# Patient Record
Sex: Female | Born: 1965 | Race: White | Hispanic: No | Marital: Single | Smoking: Current every day smoker
Health system: Southern US, Community
[De-identification: ages and names within clinical notes are randomized; demographics above are authoritative.]

## PROBLEM LIST (undated history)

## (undated) DIAGNOSIS — J111 Influenza due to unidentified influenza virus with other respiratory manifestations: Secondary | ICD-10-CM

## (undated) DIAGNOSIS — J45909 Unspecified asthma, uncomplicated: Secondary | ICD-10-CM

## (undated) DIAGNOSIS — R069 Unspecified abnormalities of breathing: Secondary | ICD-10-CM

## (undated) DIAGNOSIS — M1990 Unspecified osteoarthritis, unspecified site: Secondary | ICD-10-CM

## (undated) HISTORY — PX: CHOLECYSTECTOMY: SHX55

## (undated) HISTORY — PX: TUBAL LIGATION: SHX77

## (undated) HISTORY — DX: Unspecified osteoarthritis, unspecified site: M19.90

## (undated) HISTORY — DX: Influenza due to unidentified influenza virus with other respiratory manifestations: J11.1

## (undated) HISTORY — DX: Unspecified abnormalities of breathing: R06.9

## (undated) HISTORY — DX: Unspecified asthma, uncomplicated: J45.909

---

## 2005-01-27 ENCOUNTER — Emergency Department: Payer: Self-pay | Admitting: Emergency Medicine

## 2005-12-05 ENCOUNTER — Ambulatory Visit: Payer: Self-pay | Admitting: Internal Medicine

## 2006-08-10 ENCOUNTER — Ambulatory Visit: Payer: Self-pay | Admitting: Family Medicine

## 2006-08-13 ENCOUNTER — Ambulatory Visit: Payer: Self-pay | Admitting: Family Medicine

## 2006-10-21 ENCOUNTER — Ambulatory Visit: Payer: Self-pay | Admitting: Gastroenterology

## 2007-03-01 ENCOUNTER — Emergency Department: Payer: Self-pay | Admitting: Emergency Medicine

## 2007-03-10 ENCOUNTER — Ambulatory Visit: Payer: Self-pay | Admitting: Gastroenterology

## 2007-11-14 ENCOUNTER — Emergency Department: Payer: Self-pay | Admitting: Emergency Medicine

## 2008-08-14 ENCOUNTER — Emergency Department: Payer: Self-pay | Admitting: Emergency Medicine

## 2008-10-04 ENCOUNTER — Emergency Department: Payer: Self-pay | Admitting: Emergency Medicine

## 2013-05-11 ENCOUNTER — Ambulatory Visit: Payer: Self-pay | Admitting: Obstetrics and Gynecology

## 2013-05-11 LAB — BASIC METABOLIC PANEL
Anion Gap: 5 — ABNORMAL LOW (ref 7–16)
BUN: 9 mg/dL (ref 7–18)
Chloride: 102 mmol/L (ref 98–107)
Creatinine: 0.62 mg/dL (ref 0.60–1.30)
EGFR (African American): >60
EGFR (Non-African Amer.): >60
Glucose: 95 mg/dL (ref 65–99)
Potassium: 3.2 mmol/L — ABNORMAL LOW (ref 3.5–5.1)

## 2013-05-11 LAB — CBC
HCT: 43.7 % (ref 35.0–47.0)
HGB: 15.4 g/dL (ref 12.0–16.0)
MCHC: 35.3 g/dL (ref 32.0–36.0)
MCV: 92 fL (ref 80–100)
Platelet: 236 10*3/uL (ref 150–440)

## 2013-05-16 ENCOUNTER — Ambulatory Visit: Payer: Self-pay | Admitting: Obstetrics and Gynecology

## 2013-05-16 LAB — POTASSIUM: Potassium: 3.5 mmol/L (ref 3.5–5.1)

## 2013-05-17 LAB — CBC WITH DIFFERENTIAL/PLATELET
Basophil %: 0.4 %
Eosinophil #: 0.3 10*3/uL (ref 0.0–0.7)
Eosinophil %: 2.2 %
HCT: 35.7 % (ref 35.0–47.0)
HGB: 12.4 g/dL (ref 12.0–16.0)
Lymphocyte #: 3.9 10*3/uL — ABNORMAL HIGH (ref 1.0–3.6)
MCH: 32.3 pg (ref 26.0–34.0)
MCHC: 34.7 g/dL (ref 32.0–36.0)
MCV: 93 fL (ref 80–100)
Monocyte #: 0.9 x10 3/mm (ref 0.2–0.9)
Monocyte %: 7.4 %
Neutrophil %: 58.2 %
Platelet: 192 10*3/uL (ref 150–440)
RBC: 3.84 10*6/uL (ref 3.80–5.20)
RDW: 12.2 % (ref 11.5–14.5)

## 2014-07-17 ENCOUNTER — Ambulatory Visit: Payer: Self-pay | Admitting: Pain Medicine

## 2014-08-11 ENCOUNTER — Ambulatory Visit: Payer: Self-pay | Admitting: Pain Medicine

## 2014-08-11 LAB — BASIC METABOLIC PANEL
ANION GAP: 7 (ref 7–16)
BUN: 14 mg/dL (ref 7–18)
CREATININE: 0.78 mg/dL (ref 0.60–1.30)
Calcium, Total: 8.2 mg/dL — ABNORMAL LOW (ref 8.5–10.1)
Chloride: 103 mmol/L (ref 98–107)
Co2: 28 mmol/L (ref 21–32)
GLUCOSE: 97 mg/dL (ref 65–99)
OSMOLALITY: 276 (ref 275–301)
Potassium: 3.1 mmol/L — ABNORMAL LOW (ref 3.5–5.1)
Sodium: 138 mmol/L (ref 136–145)

## 2014-08-11 LAB — HEPATIC FUNCTION PANEL A (ARMC)
ALT: 23 U/L
Albumin: 3.6 g/dL (ref 3.4–5.0)
Alkaline Phosphatase: 80 U/L
Bilirubin, Direct: <0.1 mg/dL (ref 0.0–0.2)
Bilirubin,Total: 0.5 mg/dL (ref 0.2–1.0)
SGOT(AST): 20 U/L (ref 15–37)
Total Protein: 7.1 g/dL (ref 6.4–8.2)

## 2014-08-11 LAB — MAGNESIUM: MAGNESIUM: 1.6 mg/dL — AB

## 2014-08-11 LAB — SEDIMENTATION RATE: Erythrocyte Sed Rate: 11 mm/hr (ref 0–20)

## 2014-09-15 ENCOUNTER — Ambulatory Visit: Payer: Self-pay | Admitting: Pain Medicine

## 2015-01-19 NOTE — Op Note (Signed)
PATIENT NAME:  Vicki Fox, Vicki Fox MR#:  [id number] DATE OF BIRTH:  [birth year]  DATE OF PROCEDURE:  05/16/2013  PREOPERATIVE DIAGNOSIS: Chronic pelvic pain.   POSTOPERATIVE DIAGNOSES: 1.  Chronic pelvic pain.  2.  Dense pelvic adhesive disease.   OPERATIVE PROCEDURE: Laparoscopy with adhesiolysis.   SURGEON: Alanda Slim. Cresencia Asmus, MD  FIRST ASSISTANT: Herbert Moors  ANESTHESIA: General endotracheal.   INDICATIONS: The patient is a 49 year old single white female para 2-0-0-2 status post BTL for contraception with history of chronic lower abdominal pain who presents for surgical evaluation. Preoperative endometrial biopsy demonstrated chronic endometritis. Treatment with antibiotics did not alter her pelvic pain symptoms. The patient is now here to assess for etiology to pain including ruling out of endometriosis.   FINDINGS AT SURGERY: Revealed extensive abdominal wall, small bowel adhesive disease, primarily in the midline. There also were bilateral adnexal adhesions as well as lower uterine segment, anterior abdominal wall adhesions. Band of adhesions between the right abdominal sidewall and bowel were lysed. Adhesions overlying the bladder flap were also lysed. Findings intraoperatively included a uterus with a posterior fundal fibroid. The adhesions were noted in the bladder flap region. The right and left ovaries appeared normal, although the left ovary did demonstrate a simple cyst. Fallopian tubes were hyperemic and had the fallopian tubes nearby. Cul-de-sac did not reveal any obvious stigmata of endometriosis. Upper abdomen had extensive adhesions of the bowel to the midline. This area of involvement included the umbilicus where the primary trocar was placed. There was no evidence of bowel or vascular injury at the time of entry. However, the patient will be observed overnight to be sure there are no bowel issues.   DESCRIPTION OF PROCEDURE: The patient was brought to the operating room  where she was placed in the supine position. General endotracheal anesthesia was induced without difficulty. She was placed in the dorsal lithotomy position using the bumblebee stirrups. A ChloraPrep and Betadine abdominal, perineal and intravaginal prep and drape was performed in the standard fashion. A red Robinson catheter was used to drain 50 mL of urine from the bladder. A Hulka tenaculum was placed on the cervix to facilitate uterine manipulation. A subumbilical vertical incision just slightly to the right of midline was placed, and the 5 mm Optiview laparoscopic trocar was placed under direct visualization without evidence of bowel or vascular injury. The peritoneum was entered. CO2 pneumoperitoneum was created. A second port was placed in the left paramedian region, in the suprapubic region, under direct visualization. Further inspection demonstrated the above-noted findings, which were photo documented. Adhesions along the right abdominal wall were taken down using the Harmonic scalpel. Adhesions involving the bladder flap were likewise taken down with the Harmonic scalpel. The extensive adhesions between the loops of bowel and the anterior abdominal wall were not touched. Close observation of the initial entry port was made without obvious evidence of injury to bowel. However, because of the close proximity of bowel to the entry location we will observe the patient overnight. There was minimal bleeding encountered. Following completion of complete inspection of the pelvis, the procedure was terminated with all instrumentation being removed from the abdomen. The pneumoperitoneum was released. The incisions were closed with 4-0 Vicryl simple interrupted sutures and Dermabond was placed over top. The patient was then awakened, extubated and taken to the recovery room in satisfactory condition. Estimated was minimal. There were no complications none. All instruments, needle and sponge counts were verified as  correct.   ADDENDUM NOTE: Should this  patient require any future surgery, in the form of hysterectomy for her pelvic pain, a bowel prep and laparotomy probably in the midline will be needed with care to avoid bowel injury. I recommend a co-operation with a general should hysterectomy be needed.  ____________________________ Alanda Slim Catherine Cubero, MD mad:sb D: 05/16/2013 16:26:13 ET T: 05/17/2013 07:21:40 ET JOB#: [id number]  cc: Hassell Done A. Gerilyn Stargell, MD, <Dictator> Alanda Slim Raydell Maners MD ELECTRONICALLY SIGNED 05/27/2013 10:34

## 2015-04-12 ENCOUNTER — Ambulatory Visit: Payer: Self-pay | Admitting: Family Medicine

## 2015-04-19 ENCOUNTER — Encounter: Payer: Self-pay | Admitting: Family Medicine

## 2015-04-19 ENCOUNTER — Ambulatory Visit (INDEPENDENT_AMBULATORY_CARE_PROVIDER_SITE_OTHER): Payer: BLUE CROSS/BLUE SHIELD | Admitting: Family Medicine

## 2015-04-19 VITALS — BP 110/70 | HR 76 | Ht 58.5 in | Wt 138.8 lb

## 2015-04-19 DIAGNOSIS — G8929 Other chronic pain: Secondary | ICD-10-CM | POA: Diagnosis not present

## 2015-04-19 DIAGNOSIS — I10 Essential (primary) hypertension: Secondary | ICD-10-CM | POA: Insufficient documentation

## 2015-04-19 DIAGNOSIS — Z833 Family history of diabetes mellitus: Secondary | ICD-10-CM | POA: Insufficient documentation

## 2015-04-19 DIAGNOSIS — E559 Vitamin D deficiency, unspecified: Secondary | ICD-10-CM

## 2015-04-19 DIAGNOSIS — E663 Overweight: Secondary | ICD-10-CM | POA: Diagnosis not present

## 2015-04-19 DIAGNOSIS — K5900 Constipation, unspecified: Secondary | ICD-10-CM | POA: Diagnosis not present

## 2015-04-19 DIAGNOSIS — E039 Hypothyroidism, unspecified: Secondary | ICD-10-CM

## 2015-04-19 DIAGNOSIS — R109 Unspecified abdominal pain: Secondary | ICD-10-CM

## 2015-04-19 DIAGNOSIS — M797 Fibromyalgia: Secondary | ICD-10-CM

## 2015-04-19 MED ORDER — AMITRIPTYLINE HCL 25 MG PO TABS: 25.0000 mg | ORAL_TABLET | Freq: Every day | ORAL | Status: DC

## 2015-04-19 NOTE — Progress Notes (Signed)
Date:  04/19/2015   Name:  Vicki Fox   DOB:  1966-07-04   MRN:  [id number]  PCP:  Adline Potter, MD    Chief Complaint: No chief complaint on file.   History of Present Illness:  This is a 49 y.o. female with MMP as listed.Seeing pain med for chronic abdominal pain rx'ed with Percocet recently increased from bid to tid. C/o hand/hip/back pain, thinks has fibromyalgia, not sleeping well. C/o constipation, taking stool softeners, has tried mother's Miralax which seems to help. Thinks Synthroid dose too low, wants checked. Also thinks HCTZ dose low, takes daily but still gets occ pedal edema (none today), potassium and magnesium levels low in November.  Review of Systems:  Review of Systems  Constitutional: Positive for fatigue. Negative for fever.  HENT: Negative for ear pain and sore throat.   Eyes: Negative for pain.  Respiratory: Negative for shortness of breath.   Cardiovascular: Positive for leg swelling. Negative for chest pain.  Gastrointestinal: Positive for abdominal pain and constipation. Negative for vomiting and diarrhea.  Endocrine: Negative for polyuria.  Genitourinary: Negative for difficulty urinating.  Musculoskeletal: Positive for back pain and neck pain. Negative for gait problem.  Skin: Negative for rash.  Neurological: Negative for tremors, seizures and syncope.  Hematological: Negative for adenopathy.  Psychiatric/Behavioral: Negative for confusion.    Patient Active Problem List   Diagnosis Date Noted  . Fibromyalgia 04/19/2015  . Essential hypertension 04/19/2015  . Thyroid activity decreased 04/19/2015  . Vitamin D deficiency 04/19/2015  . Chronic abdominal pain 04/19/2015  . CN (constipation) 04/19/2015  . Overweight (BMI 25.0-29.9) 04/19/2015  . FH: diabetes mellitus 04/19/2015    Prior to Admission medications   Medication Sig Start Date End Date Taking? Authorizing Provider  hydrochlorothiazide (HYDRODIURIL) 25 MG tablet Take 1 tablet by  mouth daily at 2 PM daily at 2 PM. 04/01/15  Yes Historical Provider, MD  levothyroxine (SYNTHROID, LEVOTHROID) 200 MCG tablet Take 1 tablet by mouth daily. 04/01/15  Yes Historical Provider, MD  oxyCODONE-acetaminophen (PERCOCET) 10-325 MG per tablet Take 1 tablet by mouth every 8 (eight) hours. 04/10/15  Yes Historical Provider, MD  amitriptyline (ELAVIL) 25 MG tablet Take 1 tablet (25 mg total) by mouth at bedtime. 04/19/15   Adline Potter, MD    No Known Allergies  Past Surgical History  Procedure Laterality Date  . Cesarean section    . Cholecystectomy    . Tubal ligation      History  Substance Use Topics  . Smoking status: Current Every Day Smoker -- 1.00 packs/day for 30 years    Types: Cigarettes  . Smokeless tobacco: Not on file  . Alcohol Use: 0.0 oz/week    0 Standard drinks or equivalent per week    Family History  Problem Relation Age of Onset  . Diabetes Mother   . Diabetes Father   . Heart disease Father     Medication list has been reviewed and updated.  Physical Examination: BP 110/70 mmHg  Pulse 76  Ht 4' 10.5" (1.486 m)  Wt 138 lb 12.8 oz (62.959 kg)  BMI 28.51 kg/m2  Physical Exam  Constitutional: She is oriented to person, place, and time. She appears well-developed and well-nourished.  HENT:  Head: Normocephalic and atraumatic.  Eyes: Conjunctivae and EOM are normal. Pupils are equal, round, and reactive to light.  Neck: Neck supple. No thyromegaly present.  Cardiovascular: Normal rate, regular rhythm and normal heart sounds.   Pulmonary/Chest: Effort normal and breath  sounds normal.  Abdominal: Soft. She exhibits no distension and no mass. There is no tenderness.  Musculoskeletal: She exhibits no edema.  Lymphadenopathy:    She has no cervical adenopathy.  Neurological: She is alert and oriented to person, place, and time. Coordination normal.  Skin: No rash noted.  Psychiatric: She has a normal mood and affect. Her behavior is normal.     Assessment and Plan:  1. Fibromyalgia Begin Elavil 25 mg qhs  2. Essential hypertension Well controlled, cont HCTZ (needs refill if blood work ok) - Comprehensive Metabolic Panel (CMET) - CBC - Lipid Profile  3. Hypothyroidism, unspecified hypothyroidism type Needs refill if blood work ok - TSH  4. Vitamin D deficiency Continue supplementation - Vitamin D (25 hydroxy)  5. Chronic abdominal pain Cont Percocet per pain med  6. Constipation, unspecified constipation type ? Due to Percocet, ok to add Miralax  7. Overweight (BMI 25.0-29.9) Discussed weight Fox, exercise  8. FH: diabetes mellitus - HgB A1c  Return in about 4 weeks (around 05/17/2015).  Satira Anis. [facility]  04/19/2015

## 2015-04-30 NOTE — Progress Notes (Signed)
Please contact to see why blood work not done.

## 2015-05-02 ENCOUNTER — Telehealth: Payer: Self-pay

## 2015-05-02 NOTE — Telephone Encounter (Signed)
-----   Message from Adline Potter, MD sent at 04/30/2015 11:34 AM EDT ----- Please contact to see why blood work not done.

## 2015-05-21 ENCOUNTER — Other Ambulatory Visit: Payer: Self-pay

## 2015-05-21 ENCOUNTER — Ambulatory Visit: Payer: BLUE CROSS/BLUE SHIELD | Admitting: Family Medicine

## 2015-06-25 ENCOUNTER — Ambulatory Visit: Payer: Self-pay | Admitting: Family Medicine

## 2015-09-05 ENCOUNTER — Encounter: Payer: Self-pay | Admitting: Pain Medicine

## 2015-09-05 ENCOUNTER — Ambulatory Visit: Payer: BLUE CROSS/BLUE SHIELD | Attending: Pain Medicine | Admitting: Pain Medicine

## 2015-09-05 ENCOUNTER — Other Ambulatory Visit: Payer: Self-pay | Admitting: Pain Medicine

## 2015-09-05 VITALS — BP 142/68 | HR 71 | Temp 98.8°F | Resp 18 | Ht <= 58 in | Wt 136.0 lb

## 2015-09-05 DIAGNOSIS — M79606 Pain in leg, unspecified: Secondary | ICD-10-CM

## 2015-09-05 DIAGNOSIS — Z79891 Long term (current) use of opiate analgesic: Secondary | ICD-10-CM | POA: Insufficient documentation

## 2015-09-05 DIAGNOSIS — M160 Bilateral primary osteoarthritis of hip: Secondary | ICD-10-CM | POA: Diagnosis not present

## 2015-09-05 DIAGNOSIS — K660 Peritoneal adhesions (postprocedural) (postinfection): Secondary | ICD-10-CM | POA: Insufficient documentation

## 2015-09-05 DIAGNOSIS — Z5181 Encounter for therapeutic drug level monitoring: Secondary | ICD-10-CM

## 2015-09-05 DIAGNOSIS — F1190 Opioid use, unspecified, uncomplicated: Secondary | ICD-10-CM

## 2015-09-05 DIAGNOSIS — N949 Unspecified condition associated with female genital organs and menstrual cycle: Secondary | ICD-10-CM

## 2015-09-05 DIAGNOSIS — M797 Fibromyalgia: Secondary | ICD-10-CM

## 2015-09-05 DIAGNOSIS — I10 Essential (primary) hypertension: Secondary | ICD-10-CM | POA: Insufficient documentation

## 2015-09-05 DIAGNOSIS — M5416 Radiculopathy, lumbar region: Secondary | ICD-10-CM

## 2015-09-05 DIAGNOSIS — M47816 Spondylosis without myelopathy or radiculopathy, lumbar region: Secondary | ICD-10-CM | POA: Insufficient documentation

## 2015-09-05 DIAGNOSIS — J439 Emphysema, unspecified: Secondary | ICD-10-CM | POA: Diagnosis not present

## 2015-09-05 DIAGNOSIS — F17200 Nicotine dependence, unspecified, uncomplicated: Secondary | ICD-10-CM

## 2015-09-05 DIAGNOSIS — M545 Low back pain: Secondary | ICD-10-CM

## 2015-09-05 DIAGNOSIS — F17290 Nicotine dependence, other tobacco product, uncomplicated: Secondary | ICD-10-CM

## 2015-09-05 DIAGNOSIS — Z720 Tobacco use: Secondary | ICD-10-CM

## 2015-09-05 DIAGNOSIS — R7982 Elevated C-reactive protein (CRP): Secondary | ICD-10-CM

## 2015-09-05 DIAGNOSIS — E041 Nontoxic single thyroid nodule: Secondary | ICD-10-CM

## 2015-09-05 DIAGNOSIS — F329 Major depressive disorder, single episode, unspecified: Secondary | ICD-10-CM | POA: Diagnosis not present

## 2015-09-05 DIAGNOSIS — R109 Unspecified abdominal pain: Secondary | ICD-10-CM

## 2015-09-05 DIAGNOSIS — R102 Pelvic and perineal pain: Secondary | ICD-10-CM | POA: Diagnosis not present

## 2015-09-05 DIAGNOSIS — J449 Chronic obstructive pulmonary disease, unspecified: Secondary | ICD-10-CM | POA: Insufficient documentation

## 2015-09-05 DIAGNOSIS — G8929 Other chronic pain: Secondary | ICD-10-CM | POA: Insufficient documentation

## 2015-09-05 DIAGNOSIS — M5410 Radiculopathy, site unspecified: Secondary | ICD-10-CM

## 2015-09-05 DIAGNOSIS — E559 Vitamin D deficiency, unspecified: Secondary | ICD-10-CM

## 2015-09-05 DIAGNOSIS — M25559 Pain in unspecified hip: Secondary | ICD-10-CM

## 2015-09-05 DIAGNOSIS — G5601 Carpal tunnel syndrome, right upper limb: Secondary | ICD-10-CM

## 2015-09-05 DIAGNOSIS — Z79899 Other long term (current) drug therapy: Secondary | ICD-10-CM

## 2015-09-05 DIAGNOSIS — M4726 Other spondylosis with radiculopathy, lumbar region: Secondary | ICD-10-CM

## 2015-09-05 DIAGNOSIS — K829 Disease of gallbladder, unspecified: Secondary | ICD-10-CM | POA: Insufficient documentation

## 2015-09-05 DIAGNOSIS — G629 Polyneuropathy, unspecified: Secondary | ICD-10-CM

## 2015-09-05 DIAGNOSIS — F1120 Opioid dependence, uncomplicated: Secondary | ICD-10-CM

## 2015-09-05 DIAGNOSIS — F32A Depression, unspecified: Secondary | ICD-10-CM | POA: Insufficient documentation

## 2015-09-05 DIAGNOSIS — F17210 Nicotine dependence, cigarettes, uncomplicated: Secondary | ICD-10-CM | POA: Insufficient documentation

## 2015-09-05 DIAGNOSIS — M549 Dorsalgia, unspecified: Secondary | ICD-10-CM | POA: Diagnosis present

## 2015-09-05 DIAGNOSIS — N736 Female pelvic peritoneal adhesions (postinfective): Secondary | ICD-10-CM | POA: Insufficient documentation

## 2015-09-05 DIAGNOSIS — R1084 Generalized abdominal pain: Secondary | ICD-10-CM | POA: Insufficient documentation

## 2015-09-05 DIAGNOSIS — Z7189 Other specified counseling: Secondary | ICD-10-CM

## 2015-09-05 DIAGNOSIS — M792 Neuralgia and neuritis, unspecified: Secondary | ICD-10-CM | POA: Insufficient documentation

## 2015-09-05 MED ORDER — OXYCODONE-ACETAMINOPHEN 10-325 MG PO TABS: 1.0000 | ORAL_TABLET | Freq: Three times a day (TID) | ORAL | Status: DC | PRN

## 2015-09-05 NOTE — Progress Notes (Signed)
Safety precautions to be maintained throughout the outpatient stay will include: orient to surroundings, keep bed in low position, maintain call bell within reach at all times, provide assistance with transfer out of bed and ambulation. Pill count oxycodone #2 

## 2015-09-05 NOTE — Progress Notes (Signed)
Patient's Name: Vicki Fox MRN: [id number] DOB: [birth year] DOS: 09/05/2015  Primary Reason(s) for Visit: Encounter for Medication Management CC: Back Pain   HPI:   Ms. Gunn is a 49 y.o. year old, female patient, who returns today as an established patient. She has Fibromyalgia; Essential hypertension; Hypothyroidism; Vitamin D deficiency; Chronic abdominal pain; CN (constipation); Overweight (BMI 25.0-29.9); FH: diabetes mellitus; Chronic pain; Long term current use of opiate analgesic; Long term prescription opiate use; Opiate use; Opioid dependence, daily use ([place]); Encounter for therapeutic drug level monitoring; Encounter for chronic pain management; Depression; Gallbladder disease; Adhesions Pelvic; Generalized abdominal pain (Location of Primary Pain); Adhesions Abdominal; Chronic pelvic pain in female; Chronic low back pain (Right); Lumbar spondylosis; Chronic lower extremity pain; Chronic lumbar radicular pain; Osteoarthritis, hip, bilateral; Chronic hip pain (Bilateral) (R>L); CRP elevated; Thyroid nodule; COPD with emphysema ([place]); Nicotine dependence; Hypomagnesemia; Tobacco abuse; Neuropathy ([place]); Neurogenic pain; Neuropathic pain; and Right carpal tunnel syndrome on her problem list.. Her primarily concern today is the Back Pain     The patient returns to the clinic today for pharmacological management of her chronic pain. The patient is pending to be evaluated by her primary care physician tomorrow since apparently they found a thyroid nodule. She will will be worked up for this. With regards to her chronic problems, they seem to be stable at this point with the current therapy.  Today's Pain Score: 7 , clinically she looks like a 1-2/10. Reported level of pain is incompatible with clinical obrservations. This may be secondary to a possible lack of understanding on how the pain scale works. Pain Type: Chronic pain Pain Location: Back (abdomen) Pain Orientation: Lower Pain  Descriptors / Indicators: Sharp Pain Frequency: Constant  Date of Last Visit: 05/16/15 Service Provided on Last Visit: Med Refill  Pharmacotherapy Review:   Side-effects or Adverse reactions: None reported. Effectiveness: Described as relatively effective, allowing for increase in activities of daily living (ADL). Onset of action: Within expected pharmacological parameters. Duration of action: Within normal limits for medication. Peak effect: Timing and results are as within normal expected parameters. [place] PMP: Compliant with practice rules and regulations. UDS Results:  No UDS available, at this time. UDS Interpretation: No UDS available, at this time Medication Assessment Form: Reviewed. Patient indicates being compliant with therapy Treatment compliance: Compliant Substance Use Disorder (SUD) Risk Level: Low Pharmacologic Plan: Continue therapy as is  Last Available Lab Work: No visits with results within 3 Month(s) from this visit. Latest known visit with results is:  [facility] Conversion on 08/11/2014  Component Date Value Ref Range Status  . Magnesium 08/11/2014 1.6*  Final  . Glucose 08/11/2014 97  65-99 mg/dL Final  . BUN 08/11/2014 14  7-18 mg/dL Final  . Creatinine 08/11/2014 0.78  0.60-1.30 mg/dL Final  . Sodium 08/11/2014 138  136-145 mmol/L Final  . Potassium 08/11/2014 3.1* 3.5-5.1 mmol/L Final  . Chloride 08/11/2014 103  98-107 mmol/L Final  . Co2 08/11/2014 28  21-32 mmol/L Final  . Calcium, Total 08/11/2014 8.2* 8.5-10.1 mg/dL Final  . Osmolality 08/11/2014 276  275-301 Final  . Anion Gap 08/11/2014 7  7-16 Final  . EGFR (African American) 08/11/2014 >60  >60m/min Final  . EGFR (Non-African Amer.) 08/11/2014 >60  >634mmin Final  . SGOT(AST) 08/11/2014 20  15-37 Unit/L Final  . SGPT (ALT) 08/11/2014 23   Final  . Alkaline Phosphatase 08/11/2014 80   Final  . Albumin 08/11/2014 3.6  3.4-5.0 g/dL Final  . Total Protein 08/11/2014  7.1  6.4-8.2 g/dL Final  .  Bilirubin,Total 08/11/2014 0.5  0.2-1.0 mg/dL Final  . Bilirubin, Direct 08/11/2014 < 0.1  0.0-0.2 mg/dL Final  . Erythrocyte Sed Rate 08/11/2014 11  0-20 mm/hr Final    Allergies: Ms. Vankirk has No Known Allergies.  Meds: The patient has a current medication list which includes the following prescription(s): amitriptyline, vitamin d3, hydrochlorothiazide, ibuprofen, levothyroxine, oxycodone-acetaminophen, polyethylene glycol powder, oxycodone-acetaminophen, and oxycodone-acetaminophen. Requested Prescriptions   Signed Prescriptions Disp Refills  . oxyCODONE-acetaminophen (PERCOCET) 10-325 MG tablet 90 tablet 0    Sig: Take 1 tablet by mouth every 8 (eight) hours as needed for pain.  Marland Kitchen oxyCODONE-acetaminophen (PERCOCET) 10-325 MG tablet 90 tablet 0    Sig: Take 1 tablet by mouth every 8 (eight) hours as needed for pain.  Marland Kitchen oxyCODONE-acetaminophen (PERCOCET) 10-325 MG tablet 90 tablet 0    Sig: Take 1 tablet by mouth every 8 (eight) hours as needed for pain.    ROS: Constitutional: Afebrile, no chills, well hydrated and well nourished Gastrointestinal: negative Musculoskeletal:negative Neurological: negative Behavioral/Psych: negative  PFSH: Medical:  Ms. Creely  has a past medical history of Arthritis and Asthma. Family: family history includes Diabetes in her father and mother; Heart disease in her father. Surgical:  has past surgical history that includes Cesarean section; Cholecystectomy; and Tubal ligation. Tobacco:  reports that she has been smoking Cigarettes.  She has a 30 pack-year smoking history. She does not have any smokeless tobacco history on file. Alcohol:  reports that she drinks alcohol. Drug:  reports that she does not use illicit drugs.  Physical Exam: Vitals:  Today's Vitals   09/05/15 0801 09/05/15 0803  BP: 142/68   Pulse: 71   Temp: 98.8 F (37.1 C)   Resp: 18   Height: 4' 10"  (1.473 m)   Weight: 136 lb (61.689 kg)   SpO2: 99%   PainSc: 7  7    PainLoc: Back   Calculated BMI: Body mass index is 28.43 kg/(m^2). General appearance: alert, cooperative, appears stated age, no distress and mildly obese Eyes: PERLA Respiratory: No evidence respiratory distress, no audible rales or ronchi and no use of accessory muscles of respiration Neck: no adenopathy, no carotid bruit, no JVD, supple, symmetrical, trachea midline and thyroid not enlarged, symmetric, no tenderness/mass/nodules  Cervical Spine ROM: Adequate for flexion, extension, rotation, and lateral bending Palpation: No palpable trigger points  Upper Extremities ROM: Adequate bilaterally Strength: 5/5 for all flexors and extensors of the upper extremity, bilaterally Pulses: Palpable bilaterally Neurologic: No allodynia, No hyperesthesia, No hyperpathia and No sensory abnormalities  Lumbar Spine ROM: Adequate for flexion, extension, rotation, and lateral bending Palpation: No palpable trigger points Lumbar Hyperextension and rotation: Non-contributory Patrick's Maneuver: Non-contributory  Lower Extremities ROM: Adequate bilaterally Strength: 5/5 for all flexors and extensors of the lower extremity, bilaterally Pulses: Palpable bilaterally Neurologic: No allodynia, No hyperesthesia, No hyperpathia, No sensory abnormalities and No antalgic gait or posture  Assessment: Encounter Diagnosis:  Primary Diagnosis: Osteoarthritis of spine with radiculopathy, lumbar region [M47.26]  Plan: Interventional: None will be scheduled at this point saying she is stable.  Johnika was seen today for back pain.  Diagnoses and all orders for this visit:  Osteoarthritis of spine with radiculopathy, lumbar region  Generalized abdominal pain  Fibromyalgia  Chronic pelvic pain in female  Chronic pain -     oxyCODONE-acetaminophen (PERCOCET) 10-325 MG tablet; Take 1 tablet by mouth every 8 (eight) hours as needed for pain. -  oxyCODONE-acetaminophen (PERCOCET) 10-325 MG tablet; Take  1 tablet by mouth every 8 (eight) hours as needed for pain. -     oxyCODONE-acetaminophen (PERCOCET) 10-325 MG tablet; Take 1 tablet by mouth every 8 (eight) hours as needed for pain. -     COMPLETE METABOLIC PANEL WITH GFR; Future -     C-reactive protein; Future -     Magnesium; Future -     Sedimentation rate; Future -     Vitamin D pnl(25-hydrxy+1,25-dihy)-bld; Future -     B12 and Folate Panel; Future  Chronic lumbar radicular pain  Chronic low back pain  Chronic abdominal pain  Adhesions Pelvic  Adhesions Abdominal  Opioid dependence, daily use ([facility])  Opiate use  Long term prescription opiate use  Long term current use of opiate analgesic -     Drugs of abuse screen w/o alc, rtn urine-sln  Encounter for therapeutic drug level monitoring  Encounter for chronic pain management  Vitamin D deficiency -     Vitamin D pnl(25-hydrxy+1,25-dihy)-bld; Future  Primary osteoarthritis of both hips  Chronic hip pain, unspecified laterality  CRP elevated  Thyroid nodule  Pulmonary emphysema, unspecified emphysema type (H[facility])  Dependence on nicotine from other tobacco product  Hypomagnesemia  Tobacco abuse  Neuropathy (H[facility])  Neurogenic pain  Neuropathic pain  Right carpal tunnel syndrome     There are no Patient Instructions on file for this visit. Medications discontinued today:  Medications Discontinued During This Encounter  Medication Reason  . levothyroxine (SYNTHROID, LEVOTHROID) 200 MCG tablet Error  . oxyCODONE-acetaminophen (PERCOCET) 10-325 MG per tablet Reorder  . ondansetron (ZOFRAN) 4 MG tablet Error   Medications administered today:  Ms. Birkhead had no medications administered during this visit.  Primary Care Physician: Christie Nottingham., PA Location: [facility] Note by: Kathlen Brunswick. Dossie Arbour, M.D, DABA, DABAPM, DABPM, DABIPP, FIPP

## 2015-09-11 ENCOUNTER — Other Ambulatory Visit: Payer: Self-pay

## 2015-09-11 LAB — TOXASSURE SELECT 13 (MW), URINE: PDF: 0

## 2015-12-03 ENCOUNTER — Encounter: Payer: BLUE CROSS/BLUE SHIELD | Admitting: Pain Medicine

## 2015-12-06 ENCOUNTER — Ambulatory Visit (HOSPITAL_BASED_OUTPATIENT_CLINIC_OR_DEPARTMENT_OTHER): Payer: BLUE CROSS/BLUE SHIELD | Admitting: Pain Medicine

## 2015-12-06 ENCOUNTER — Encounter: Payer: Self-pay | Admitting: Pain Medicine

## 2015-12-06 ENCOUNTER — Other Ambulatory Visit
Admission: RE | Admit: 2015-12-06 | Discharge: 2015-12-06 | Disposition: A | Discharge status: Home / Self Care | Payer: BLUE CROSS/BLUE SHIELD | Source: Ambulatory Visit | Attending: Pain Medicine | Admitting: Pain Medicine

## 2015-12-06 VITALS — BP 153/96 | HR 74 | Temp 98.3°F | Resp 16 | Ht 58.5 in | Wt 140.0 lb

## 2015-12-06 DIAGNOSIS — G8929 Other chronic pain: Secondary | ICD-10-CM | POA: Insufficient documentation

## 2015-12-06 DIAGNOSIS — M545 Low back pain: Secondary | ICD-10-CM | POA: Diagnosis not present

## 2015-12-06 DIAGNOSIS — Z79891 Long term (current) use of opiate analgesic: Secondary | ICD-10-CM | POA: Insufficient documentation

## 2015-12-06 DIAGNOSIS — Z5181 Encounter for therapeutic drug level monitoring: Secondary | ICD-10-CM

## 2015-12-06 DIAGNOSIS — F1190 Opioid use, unspecified, uncomplicated: Secondary | ICD-10-CM

## 2015-12-06 DIAGNOSIS — M47816 Spondylosis without myelopathy or radiculopathy, lumbar region: Secondary | ICD-10-CM | POA: Insufficient documentation

## 2015-12-06 LAB — COMPREHENSIVE METABOLIC PANEL
ALBUMIN: 4.6 g/dL (ref 3.5–5.0)
ALT: 17 U/L (ref 14–54)
ANION GAP: 10 (ref 5–15)
AST: 19 U/L (ref 15–41)
Alkaline Phosphatase: 74 U/L (ref 38–126)
BILIRUBIN TOTAL: 0.5 mg/dL (ref 0.3–1.2)
BUN: 13 mg/dL (ref 6–20)
CO2: 27 mmol/L (ref 22–32)
Calcium: 9.6 mg/dL (ref 8.9–10.3)
Chloride: 101 mmol/L (ref 101–111)
Creatinine, Ser: 0.75 mg/dL (ref 0.44–1.00)
GFR calc non Af Amer: >60 mL/min (ref >60)
GLUCOSE: 91 mg/dL (ref 65–99)
POTASSIUM: 3.7 mmol/L (ref 3.5–5.1)
SODIUM: 138 mmol/L (ref 135–145)
TOTAL PROTEIN: 7.9 g/dL (ref 6.5–8.1)

## 2015-12-06 LAB — SEDIMENTATION RATE: Sed Rate: 3 mm/hr (ref 0–20)

## 2015-12-06 LAB — MAGNESIUM: Magnesium: 1.6 mg/dL — ABNORMAL LOW (ref 1.7–2.4)

## 2015-12-06 MED ORDER — OXYCODONE-ACETAMINOPHEN 10-325 MG PO TABS: 1.0000 | ORAL_TABLET | Freq: Three times a day (TID) | ORAL | Status: DC | PRN

## 2015-12-06 NOTE — Patient Instructions (Addendum)
Smoking Cessation, Tips for Success If you are ready to quit smoking, congratulations! You have chosen to help yourself be healthier. Cigarettes bring nicotine, tar, carbon monoxide, and other irritants into your body. Your lungs, heart, and blood vessels will be able to work better without these poisons. There are many different ways to quit smoking. Nicotine gum, nicotine patches, a nicotine inhaler, or nicotine nasal spray can help with physical craving. Hypnosis, support groups, and medicines help break the habit of smoking. WHAT THINGS CAN I DO TO MAKE QUITTING EASIER?  Here are some tips to help you quit for good: 1. Pick a date when you will quit smoking completely. Tell all of your friends and family about your plan to quit on that date. 2. Do not try to slowly cut down on the number of cigarettes you are smoking. Pick a quit date and quit smoking completely starting on that day. 3. Throw away all cigarettes.  4. Clean and remove all ashtrays from your home, work, and car. 5. On a card, write down your reasons for quitting. Carry the card with you and read it when you get the urge to smoke. 6. Cleanse your body of nicotine. Drink enough water and fluids to keep your urine clear or pale yellow. Do this after quitting to flush the nicotine from your body. 7. Learn to predict your moods. Do not let a bad situation be your excuse to have a cigarette. Some situations in your life might tempt you into wanting a cigarette. 8. Never have "just one" cigarette. It leads to wanting another and another. Remind yourself of your decision to quit. 9. Change habits associated with smoking. If you smoked while driving or when feeling stressed, try other activities to replace smoking. Stand up when drinking your coffee. Brush your teeth after eating. Sit in a different chair when you read the paper. Avoid alcohol while trying to quit, and try to drink fewer caffeinated beverages. Alcohol and caffeine may urge you  to smoke. 10. Avoid foods and drinks that can trigger a desire to smoke, such as sugary or spicy foods and alcohol. 11. Ask people who smoke not to smoke around you. 12. Have something planned to do right after eating or having a cup of coffee. For example, plan to take a walk or exercise. 13. Try a relaxation exercise to calm you down and decrease your stress. Remember, you may be tense and nervous for the first 2 weeks after you quit, but this will pass. 14. Find new activities to keep your hands busy. Play with a pen, coin, or rubber band. Doodle or draw things on paper. 15. Brush your teeth right after eating. This will help cut down on the craving for the taste of tobacco after meals. You can also try mouthwash.  16. Use oral substitutes in place of cigarettes. Try using lemon drops, carrots, cinnamon sticks, or chewing gum. Keep them handy so they are available when you have the urge to smoke. 17. When you have the urge to smoke, try deep breathing. 18. Designate your home as a nonsmoking area. 19. If you are a heavy smoker, ask your health care provider about a prescription for nicotine chewing gum. It can ease your withdrawal from nicotine. 20. Reward yourself. Set aside the cigarette money you save and buy yourself something nice. 21. Look for support from others. Join a support group or smoking cessation program. Ask someone at home or at work to help you with your plan   to quit smoking. 22. Always ask yourself, "Do I need this cigarette or is this just a reflex?" Tell yourself, "Today, I choose not to smoke," or "I do not want to smoke." You are reminding yourself of your decision to quit. 23. Do not replace cigarette smoking with electronic cigarettes (commonly called e-cigarettes). The safety of e-cigarettes is unknown, and some may contain harmful chemicals. 24. If you relapse, do not give up! Plan ahead and think about what you will do the next time you get the urge to smoke. HOW WILL  I FEEL WHEN I QUIT SMOKING? You may have symptoms of withdrawal because your body is used to nicotine (the addictive substance in cigarettes). You may crave cigarettes, be irritable, feel very hungry, cough often, get headaches, or have difficulty concentrating. The withdrawal symptoms are only temporary. They are strongest when you first quit but will go away within 10-14 days. When withdrawal symptoms occur, stay in control. Think about your reasons for quitting. Remind yourself that these are signs that your body is healing and getting used to being without cigarettes. Remember that withdrawal symptoms are easier to treat than the major diseases that smoking can cause.  Even after the withdrawal is over, expect periodic urges to smoke. However, these cravings are generally short lived and will go away whether you smoke or not. Do not smoke! WHAT RESOURCES ARE AVAILABLE TO HELP ME QUIT SMOKING? Your health care provider can direct you to community resources or hospitals for support, which may include:  Group support.  Education.  Hypnosis.  Therapy.   This information is not intended to replace advice given to you by your health care provider. Make sure you discuss any questions you have with your health care provider.   Document Released: 06/13/2004 Document Revised: 10/06/2014 Document Reviewed: 03/03/2013 Elsevier Interactive Patient Education 2016 [employer] GENERAL RISKS AND COMPLICATIONS  What are the risk, side effects and possible complications? Generally speaking, most procedures are safe.  However, with any procedure there are risks, side effects, and the possibility of complications.  The risks and complications are dependent upon the sites that are lesioned, or the type of nerve block to be performed.  The closer the procedure is to the spine, the more serious the risks are.  Great care is taken when placing the radio frequency needles, block needles or lesioning probes, but  sometimes complications can occur. 1. Infection: Any time there is an injection through the skin, there is a risk of infection.  This is why sterile conditions are used for these blocks.  There are four possible types of infection. 1. Localized skin infection. 2. Central Nervous System Infection-This can be in the form of Meningitis, which can be deadly. 3. Epidural Infections-This can be in the form of an epidural abscess, which can cause pressure inside of the spine, causing compression of the spinal cord with subsequent paralysis. This would require an emergency surgery to decompress, and there are no guarantees that the patient would recover from the paralysis. 4. Discitis-This is an infection of the intervertebral discs.  It occurs in about 1% of discography procedures.  It is difficult to treat and it may lead to surgery.        2. Pain: the needles have to go through skin and soft tissues, will cause soreness.       3. Damage to internal structures:  The nerves to be lesioned may be near blood vessels or    other nerves which can   be potentially damaged.       4. Bleeding: Bleeding is more common if the patient is taking blood thinners such as  aspirin, Coumadin, Ticiid, Plavix, etc., or if he/she have some genetic predisposition  such as hemophilia. Bleeding into the spinal canal can cause compression of the spinal  cord with subsequent paralysis.  This would require an emergency surgery to  decompress and there are no guarantees that the patient would recover from the  paralysis.       5. Pneumothorax:  Puncturing of a lung is a possibility, every time a needle is introduced in  the area of the chest or upper back.  Pneumothorax refers to free air around the  collapsed lung(s), inside of the thoracic cavity (chest cavity).  Another two possible  complications related to a similar event would include: Hemothorax and Chylothorax.   These are variations of the Pneumothorax, where instead of air  around the collapsed  lung(s), you may have blood or chyle, respectively.       6. Spinal headaches: They may occur with any procedures in the area of the spine.       7. Persistent CSF (Cerebro-Spinal Fluid) leakage: This is a rare problem, but may occur  with prolonged intrathecal or epidural catheters either due to the formation of a fistulous  track or a dural tear.       8. Nerve damage: By working so close to the spinal cord, there is always a possibility of  nerve damage, which could be as serious as a permanent spinal cord injury with  paralysis.       9. Death:  Although rare, severe deadly allergic reactions known as "Anaphylactic  reaction" can occur to any of the medications used.      10. Worsening of the symptoms:  We can always make thing worse.  What are the chances of something like this happening? Chances of any of this occuring are extremely low.  By statistics, you have more of a chance of getting killed in a motor vehicle accident: while driving to the hospital than any of the above occurring .  Nevertheless, you should be aware that they are possibilities.  In general, it is similar to taking a shower.  Everybody knows that you can slip, hit your head and get killed.  Does that mean that you should not shower again?  Nevertheless always keep in mind that statistics do not mean anything if you happen to be on the wrong side of them.  Even if a procedure has a 1 (one) in a 1,000,000 (million) chance of going wrong, it you happen to be that one..Also, keep in mind that by statistics, you have more of a chance of having something go wrong when taking medications.  Who should not have this procedure? If you are on a blood thinning medication (e.g. Coumadin, Plavix, see list of "Blood Thinners"), or if you have an active infection going on, you should not have the procedure.  If you are taking any blood thinners, please inform your physician.  How should I prepare for this  procedure?  Do not eat or drink anything at least six hours prior to the procedure.  Bring a driver with you .  It cannot be a taxi.  Come accompanied by an adult that can drive you back, and that is strong enough to help you if your legs get weak or numb from the local anesthetic.  Take all of your medicines   the morning of the procedure with just enough water to swallow them.  If you have diabetes, make sure that you are scheduled to have your procedure done first thing in the morning, whenever possible.  If you have diabetes, take only half of your insulin dose and notify our nurse that you have done so as soon as you arrive at the clinic.  If you are diabetic, but only take blood sugar pills (oral hypoglycemic), then do not take them on the morning of your procedure.  You may take them after you have had the procedure.  Do not take aspirin or any aspirin-containing medications, at least eleven (11) days prior to the procedure.  They may prolong bleeding.  Wear loose fitting clothing that may be easy to take off and that you would not mind if it got stained with Betadine or blood.  Do not wear any jewelry or perfume  Remove any nail coloring.  It will interfere with some of our monitoring equipment.  NOTE: Remember that this is not meant to be interpreted as a complete list of all possible complications.  Unforeseen problems may occur.  BLOOD THINNERS The following drugs contain aspirin or other products, which can cause increased bleeding during surgery and should not be taken for 2 weeks prior to and 1 week after surgery.  If you should need take something for relief of minor pain, you may take acetaminophen which is found in Tylenol,m Datril, Anacin-3 and Panadol. It is not blood thinner. The products listed below are.  Do not take any of the products listed below in addition to any listed on your instruction sheet.  A.P.C or A.P.C with Codeine Codeine Phosphate Capsules #3  Ibuprofen Ridaura  ABC compound Congesprin Imuran rimadil  Advil Cope Indocin Robaxisal  Alka-Seltzer Effervescent Pain Reliever and Antacid Coricidin or Coricidin-D  Indomethacin Rufen  Alka-Seltzer plus Cold Medicine Cosprin Ketoprofen S-A-C Tablets  Anacin Analgesic Tablets or Capsules Coumadin Korlgesic Salflex  Anacin Extra Strength Analgesic tablets or capsules CP-2 Tablets Lanoril Salicylate  Anaprox Cuprimine Capsules Levenox Salocol  Anexsia-D Dalteparin Magan Salsalate  Anodynos Darvon compound Magnesium Salicylate Sine-off  Ansaid Dasin Capsules Magsal Sodium Salicylate  Anturane Depen Capsules Marnal Soma  APF Arthritis pain formula Dewitt's Pills Measurin Stanback  Argesic Dia-Gesic Meclofenamic Sulfinpyrazone  Arthritis Bayer Timed Release Aspirin Diclofenac Meclomen Sulindac  Arthritis pain formula Anacin Dicumarol Medipren Supac  Analgesic (Safety coated) Arthralgen Diffunasal Mefanamic Suprofen  Arthritis Strength Bufferin Dihydrocodeine Mepro Compound Suprol  Arthropan liquid Dopirydamole Methcarbomol with Aspirin Synalgos  ASA tablets/Enseals Disalcid Micrainin Tagament  Ascriptin Doan's Midol Talwin  Ascriptin A/D Dolene Mobidin Tanderil  Ascriptin Extra Strength Dolobid Moblgesic Ticlid  Ascriptin with Codeine Doloprin or Doloprin with Codeine Momentum Tolectin  Asperbuf Duoprin Mono-gesic Trendar  Aspergum Duradyne Motrin or Motrin IB Triminicin  Aspirin plain, buffered or enteric coated Durasal Myochrisine Trigesic  Aspirin Suppositories Easprin Nalfon Trillsate  Aspirin with Codeine Ecotrin Regular or Extra Strength Naprosyn Uracel  Atromid-S Efficin Naproxen Ursinus  Auranofin Capsules Elmiron Neocylate Vanquish  Axotal Emagrin Norgesic Verin  Azathioprine Empirin or Empirin with Codeine Normiflo Vitamin E  Azolid Emprazil Nuprin Voltaren  Bayer Aspirin plain, buffered or children's or timed BC Tablets or powders Encaprin Orgaran Warfarin Sodium   Buff-a-Comp Enoxaparin Orudis Zorpin  Buff-a-Comp with Codeine Equegesic Os-Cal-Gesic   Buffaprin Excedrin plain, buffered or Extra Strength Oxalid   Bufferin Arthritis Strength Feldene Oxphenbutazone   Bufferin plain or Extra Strength Feldene Capsules Oxycodone with Aspirin     Bufferin with Codeine Fenoprofen Fenoprofen Pabalate or Pabalate-SF   Buffets II Flogesic Panagesic   Buffinol plain or Extra Strength Florinal or Florinal with Codeine Panwarfarin   Buf-Tabs Flurbiprofen Penicillamine   Butalbital Compound Four-way cold tablets Penicillin   Butazolidin Fragmin Pepto-Bismol   Carbenicillin Geminisyn Percodan   Carna Arthritis Reliever Geopen Persantine   Carprofen Gold's salt Persistin   Chloramphenicol Goody's Phenylbutazone   Chloromycetin Haltrain Piroxlcam   Clmetidine heparin Plaquenil   Cllnoril Hyco-pap Ponstel   Clofibrate Hydroxy chloroquine Propoxyphen         Before stopping any of these medications, be sure to consult the physician who ordered them.  Some, such as Coumadin (Warfarin) are ordered to prevent or treat serious conditions such as "deep thrombosis", "pumonary embolisms", and other heart problems.  The amount of time that you may need off of the medication may also vary with the medication and the reason for which you were taking it.  If you are taking any of these medications, please make sure you notify your pain physician before you undergo any procedures.         Facet Blocks Patient Information  Description: The facets are joints in the spine between the vertebrae.  Like any joints in the body, facets can become irritated and painful.  Arthritis can also effect the facets.  By injecting steroids and local anesthetic in and around these joints, we can temporarily block the nerve supply to them.  Steroids act directly on irritated nerves and tissues to reduce selling and inflammation which often leads to decreased pain.  Facet blocks may be done  anywhere along the spine from the neck to the low back depending upon the location of your pain.   After numbing the skin with local anesthetic (like Novocaine), a small needle is passed onto the facet joints under x-ray guidance.  You may experience a sensation of pressure while this is being done.  The entire block usually lasts about 15-25 minutes.   Conditions which may be treated by facet blocks:   Low back/buttock pain  Neck/shoulder pain  Certain types of headaches  Preparation for the injection:  1. Do not eat any solid food or dairy products within 8 hours of your appointment. 2. You may drink clear liquid up to 3 hours before appointment.  Clear liquids include water, black coffee, juice or soda.  No milk or cream please. 3. You may take your regular medication, including pain medications, with a sip of water before your appointment.  Diabetics should hold regular insulin (if taken separately) and take 1/2 normal NPH dose the morning of the procedure.  Carry some sugar containing items with you to your appointment. 4. A driver must accompany you and be prepared to drive you home after your procedure. 5. Bring all your current medications with you. 6. An IV may be inserted and sedation may be given at the discretion of the physician. 7. A blood pressure cuff, EKG and other monitors will often be applied during the procedure.  Some patients may need to have extra oxygen administered for a short period. 8. You will be asked to provide medical information, including your allergies and medications, prior to the procedure.  We must know immediately if you are taking blood thinners (like Coumadin/Warfarin) or if you are allergic to IV iodine contrast (dye).  We must know if you could possible be pregnant.  Possible side-effects:   Bleeding from needle site  Infection (rare, may require surgery)    Nerve injury (rare)  Numbness & tingling (temporary)  Difficulty urinating (rare,  temporary)  Spinal headache (a headache worse with upright posture)  Light-headedness (temporary)  Pain at injection site (serveral days)  Decreased blood pressure (rare, temporary)  Weakness in arm/leg (temporary)  Pressure sensation in back/neck (temporary)   Call if you experience:   Fever/chills associated with headache or increased back/neck pain  Headache worsened by an upright position  New onset, weakness or numbness of an extremity below the injection site  Hives or difficulty breathing (go to the emergency room)  Inflammation or drainage at the injection site(s)  Severe back/neck pain greater than usual  New symptoms which are concerning to you  Please note:  Although the local anesthetic injected can often make your back or neck feel good for several hours after the injection, the pain will likely return. It takes 3-7 days for steroids to work.  You may not notice any pain relief for at least one week.  If effective, we will often do a series of 2-3 injections spaced 3-6 weeks apart to maximally decrease your pain.  After the initial series, you may be a candidate for a more permanent nerve block of the facets.  If you have any questions, please call #[phone] [place] to get [facility]

## 2015-12-06 NOTE — Assessment & Plan Note (Signed)
The patient's low back pain appears to be of the facetal etiology. The patient has been offered a diagnostic bilateral lumbar facet block under fluoroscopic guidance and IV sedation to [place] if this is the source of her pain. If it is, she may be a good candidate for radiofrequency ablation.

## 2015-12-06 NOTE — Progress Notes (Signed)
Safety precautions to be maintained throughout the outpatient stay will include: orient to surroundings, keep bed in low position, maintain call bell within reach at all times, provide assistance with transfer out of bed and ambulation. Percocet pill coount # 0/90  Filled 11-04-15

## 2015-12-06 NOTE — Progress Notes (Signed)
Patient's Name: Vicki Fox MRN: [id number] DOB: 06-30-1966 DOS: 12/06/2015  Primary Reason(s) for Visit: Encounter for Medication Management CC: Back Pain and Abdominal Pain   HPI  [name] is a 50 y.o. year old, female patient, who returns today as an established patient. She has Fibromyalgia; Essential hypertension; Hypothyroidism; Vitamin D deficiency; Chronic abdominal pain (Location of Secondary source of pain) (Epigastric); CN (constipation); Overweight (BMI 25.0-29.9); FH: diabetes mellitus; Chronic pain; Long term current use of opiate analgesic; Long term prescription opiate use; Opiate use (45 MME/Day); Opioid dependence, daily use ([place]); Encounter for therapeutic drug level monitoring; Encounter for chronic pain management; Depression; Gallbladder disease; Adhesions Pelvic; Generalized abdominal pain (Location of Secondary source of pain) (Epigastric); Adhesions Abdominal; Chronic pelvic pain in female; Chronic low back pain (Location of Primary Source of Pain) (Bilateral) (L>R); Lumbar spondylosis; Chronic lower extremity pain (Bilateral) (L>R); Chronic lumbar radicular pain; Osteoarthritis of hip (Location of Tertiary source of pain) (Bilateral) (L>R); Chronic hip pain (Location of Tertiary source of pain) (Bilateral) (L>R); CRP elevated; Thyroid nodule; COPD with emphysema ([place]); Nicotine dependence; Hypomagnesemia; Tobacco abuse; Neuropathy ([place]); Neurogenic pain; Neuropathic pain; Right carpal tunnel syndrome; and Lumbar facet syndrome (Location of Primary Source of Pain) (Bilateral) (L>R) on her problem list.. Her primarily concern today is the Back Pain and Abdominal Pain   The patient comes into clinic today for pharmacological management of her chronic pain. According to the patient and the primary pain is located in the lower back with the left side being worse than the right. Her second area of pain is her abdomen with most of the pain in the epigastric region. She describes that  this pain is secondary to scar tissue from a cholecystectomy that she had in 2007 where they had a complication with a bowel perforation. Her third area of most pain is the hips with the left being worst on the right. The patient has pain on hyperextension and rotation compatible with bilateral lumbar facet syndrome.  Pain Assessment: Self-Reported Pain Score: 7 , clinically she looks like a 3/10. Reported level is compatible with observation Pain Type: Chronic pain Pain Location: Back (stomach) Pain Orientation: Lower, Mid Pain Descriptors / Indicators: Stabbing, Constant Pain Frequency: Constant  Date of Last Visit: 09/05/15 Service Provided on Last Visit: Med Refill  Controlled Substance Pharmacotherapy Assessment  Analgesic: Oxycodone/APAP 10/325 one tablet every 8 hours when necessary (30 mg/day) MME/day: 45 mg/day Pharmacokinetics: Onset of action (Liberation/Absorption): Within expected pharmacological parameters. (One hour) Time to Peak effect (Distribution): Timing and results are as within normal expected parameters. (2 hours) Duration of action (Metabolism/Excretion): Within normal limits for medication. (7-8 hours) Pharmacodynamics: Analgesic Effect: 100% Activity Facilitation: Medication(s) allow patient to sit, stand, walk, and do the basic ADLs Perceived Effectiveness: Described as relatively effective, allowing for increase in activities of daily living (ADL) Side-effects or Adverse reactions: None reported Monitoring: [place] PMP: Compliant with practice rules and regulations UDS Results/interpretation: The patient's last UDS was done on 09/05/2015 and it came back within normal limits with no unexpected results. Medication Assessment Form: Reviewed. Patient indicates being compliant with therapy Treatment compliance: Compliant Risk Assessment: Aberrant Behavior: None observed today Substance Use Disorder (SUD) Risk Level: Low Opioid Risk Tool (ORT) Score: Total Score:  0 Low Risk for SUD (Score <3) Depression Scale Score: PHQ-2: PHQ-2 Total Score: 0 No depression (0) PHQ-9: PHQ-9 Total Score: 0 No depression (0-4)  Pharmacologic Plan: No change in therapy, at this time   Laboratory Workup  Last ED  UDS: No results found for: THCU, COCAINSCRNUR, PCPSCRNUR, MDMA, AMPHETMU, METHADONE, ETOH  Inflammation Markers Lab Results  Component Value Date   ESRSEDRATE 3 12/06/2015    Renal Function Lab Results  Component Value Date   BUN 13 12/06/2015   CREATININE 0.75 12/06/2015   GFRAA >60 12/06/2015   GFRNONAA >60 12/06/2015    Hepatic Function Lab Results  Component Value Date   AST 19 12/06/2015   ALT 17 12/06/2015   ALBUMIN 4.6 12/06/2015    Electrolytes Lab Results  Component Value Date   NA 138 12/06/2015   K 3.7 12/06/2015   CL 101 12/06/2015   CALCIUM 9.6 12/06/2015   MG 1.6* 12/06/2015    Allergies  Ms. Tromp has No Known Allergies.  Meds  The patient has a current medication list which includes the following prescription(s): amitriptyline, fluticasone, hydrochlorothiazide, ibuprofen, levothyroxine, oxycodone-acetaminophen, oxycodone-acetaminophen, oxycodone-acetaminophen, and polyethylene glycol powder.  Current Outpatient Prescriptions on File Prior to Visit  Medication Sig  . amitriptyline (ELAVIL) 25 MG tablet Take 1 tablet (25 mg total) by mouth at bedtime.  . hydrochlorothiazide (HYDRODIURIL) 25 MG tablet Take 1 tablet by mouth daily at 2 PM daily at 2 PM.  . ibuprofen (ADVIL,MOTRIN) 200 MG tablet Take 800 mg by mouth every 8 (eight) hours as needed.  Marland Kitchen levothyroxine (SYNTHROID, LEVOTHROID) 100 MCG tablet Take 100 mcg by mouth daily before breakfast.  . polyethylene glycol powder (GLYCOLAX/MIRALAX) powder take 17GM (DISSOLVED IN WATER) by mouth once daily   No current facility-administered medications on file prior to visit.    ROS  Constitutional: Afebrile, no chills, well hydrated and well  nourished Gastrointestinal: negative Musculoskeletal:negative Neurological: negative Behavioral/Psych: negative  PFSH  Medical:  Ms. Loveall  has a past medical history of Arthritis and Asthma. Family: family history includes Diabetes in her father and mother; Heart disease in her father. Surgical:  has past surgical history that includes Cesarean section; Cholecystectomy; and Tubal ligation. Tobacco:  reports that she has been smoking Cigarettes.  She has a 30 pack-year smoking history. She does not have any smokeless tobacco history on file. Alcohol:  reports that she drinks alcohol. Drug:  reports that she does not use illicit drugs.  Physical Exam  Vitals:  Today's Vitals   12/06/15 1257 12/06/15 1259 12/06/15 1300  BP:  153/96   Pulse: 74    Temp: 98.3 F (36.8 C)    Resp: 16    Height: 4' 10.5" (1.486 m)    Weight: 140 lb (63.504 kg)    SpO2: 98%    PainSc: 7  7  7    PainLoc: Back      Calculated BMI: Body mass index is 28.76 kg/(m^2). Overweight (25-29.9 kg/m2) - 20% higher incidence of chronic pain  General appearance: alert, cooperative, appears stated age and no distress Eyes: PERLA Respiratory: No evidence respiratory distress, no audible rales or ronchi and no use of accessory muscles of respiration  Lumbar Spine Exam  Inspection: No gross anomalies detected Alignment: Symetrical Palpation: Tender ROM:  Flexion: Restricted ROM Extension: Restricted ROM Lateral Bending: Restricted ROM Rotation: Restricted ROM Provocative Tests:  Lumbar Hyperextension and rotation test:  Positive for facet pain bilaterally Patrick's Maneuver: deferred  Gait Evaluation  Gait: WNL  Lower Extremity Exam  Inspection: No gross anomalies detected ROM: Adequate Sensory:  Normal Motor: Unremarkable  Toe walk (S1): WNL  Heal walk (L5): WNL  Assessment & Plan  Primary Diagnosis & Pertinent Problem List: The primary encounter diagnosis was Chronic pain.  Diagnoses of  Encounter for therapeutic drug level monitoring, Long term current use of opiate analgesic, Chronic low back pain (Right), Lumbar spondylosis, unspecified spinal osteoarthritis, Lumbar facet syndrome (Location of Primary Source of Pain) (Bilateral) (L>R), and Opiate use (45 MME/Day) were also pertinent to this visit.  Visit Diagnosis: 1. Chronic pain   2. Encounter for therapeutic drug level monitoring   3. Long term current use of opiate analgesic   4. Chronic low back pain (Right)   5. Lumbar spondylosis, unspecified spinal osteoarthritis   6. Lumbar facet syndrome (Location of Primary Source of Pain) (Bilateral) (L>R)   7. Opiate use (45 MME/Day)     Problem-specific Plan(s): Chronic low back pain (Location of Primary Source of Pain) (Bilateral) (L>R) The patient's low back pain appears to be of the facetal etiology. The patient has been offered a diagnostic bilateral lumbar facet block under fluoroscopic guidance and IV sedation to [place] if this is the source of her pain. If it is, she may be a good candidate for radiofrequency ablation.    Plan of Care  Pharmacotherapy (Medications Ordered): Meds ordered this encounter  Medications  . oxyCODONE-acetaminophen (PERCOCET) 10-325 MG tablet    Sig: Take 1 tablet by mouth every 8 (eight) hours as needed for pain.    Dispense:  90 tablet    Refill:  0    Do not place this medication, or any other prescription from our practice, on "Automatic Refill". Patient may have prescription filled one day early if pharmacy is closed on scheduled refill date. Do not fill until: 12/06/15 To last until: 01/05/16  . oxyCODONE-acetaminophen (PERCOCET) 10-325 MG tablet    Sig: Take 1 tablet by mouth every 8 (eight) hours as needed for pain.    Dispense:  90 tablet    Refill:  0    Do not place this medication, or any other prescription from our practice, on "Automatic Refill". Patient may have prescription filled one day early if pharmacy is closed  on scheduled refill date. Do not fill until: 01/05/16 To last until: 02/04/16  . oxyCODONE-acetaminophen (PERCOCET) 10-325 MG tablet    Sig: Take 1 tablet by mouth every 8 (eight) hours as needed for pain.    Dispense:  90 tablet    Refill:  0    Do not place this medication, or any other prescription from our practice, on "Automatic Refill". Patient may have prescription filled one day early if pharmacy is closed on scheduled refill date. Do not fill until: 02/04/16 To last until: 03/05/16    [facility] & Procedure Ordered: Orders Placed This Encounter  Procedures  . LUMBAR FACET(MEDIAL BRANCH NERVE BLOCK) MBNB    Standing Status: Standing     Number of Occurrences: 1     Standing Expiration Date: 12/05/2016    Scheduling Instructions:     Side: Bilateral     Level: L2, L3, L4, L5, & S1 Medial Branch Nerve     Sedation: With Sedation.     Timeframe: PRN Procedure. Patient will call to schedule.    Order Specific Question:  Where will this procedure be performed?    Answer:  [facility]  . ToxASSURE Select 13 (MW), Urine    Volume: 30 ml(s). Minimum 3 ml of urine is needed. Document temperature of fresh sample. Indications: Long term (current) use of opiate analgesic (Z79.891)  . Comprehensive metabolic panel    Standing Status: Future     Number of Occurrences: 1  Standing Expiration Date: 01/05/2016    Order Specific Question:  Has the patient fasted?    Answer:  No  . C-reactive protein    Standing Status: Future     Number of Occurrences: 1     Standing Expiration Date: 01/05/2016  . Magnesium    Standing Status: Future     Number of Occurrences: 1     Standing Expiration Date: 01/05/2016  . Sedimentation rate    Standing Status: Future     Number of Occurrences: 1     Standing Expiration Date: 01/05/2016  . Vitamin B12    Indication: Bone Pain (M89.9)    Standing Status: Future     Number of Occurrences: 1     Standing Expiration Date: 01/05/2016  . Vitamin D  pnl(25-hydrxy+1,25-dihy)-bld    Standing Status: Future     Number of Occurrences: 1     Standing Expiration Date: 01/05/2016    Imaging Ordered: None  Interventional Therapies: Scheduled: None at this time. PRN Procedures: Diagnostic, bilateral, lumbar facet block under fluoroscopic guidance and IV sedation.    Referral(s) or Consult(s): None at this time.  Medications administered during this visit: Ms. Everist had no medications administered during this visit.  Future Appointments Date Time Provider [place]  03/05/2016 1:20 PM Milinda Pointer, MD [facility] None    Primary Care Physician: Christie Nottingham., PA Location: [facility] Facility Note by: Kathlen Brunswick Dossie Arbour, M.D, DABA, DABAPM, DABPM, DABIPP, FIPP  Pain Score Disclaimer: We use the NRS-11 scale. This is a self-reported, subjective measurement of pain severity with only modest accuracy. It is used primarily to identify changes within a particular patient. It must be understood that outpatient pain scales are significantly less accurate that those used for research, where they can be applied under ideal controlled circumstances with minimal exposure to variables. In reality, the score is likely to be a combination of pain intensity and pain affect, where pain affect describes the degree of emotional arousal or changes in action readiness caused by the sensory experience of pain. Factors such as social and work situation, setting, emotional state, anxiety levels, expectation, and prior pain experience may influence pain perception and show large inter-individual differences that may also be affected by time variables.

## 2015-12-07 LAB — VITAMIN D PNL(25-HYDRXY+1,25-DIHY)-BLD
Vit D, 1,25-Dihydroxy: 28.6 pg/mL (ref 19.9–79.3)
Vit D, 25-Hydroxy: 20.9 ng/mL — ABNORMAL LOW (ref 30.0–100.0)

## 2015-12-07 LAB — VITAMIN B12: VITAMIN B 12: 306 pg/mL (ref 180–914)

## 2015-12-07 LAB — C-REACTIVE PROTEIN

## 2015-12-13 ENCOUNTER — Other Ambulatory Visit: Payer: Self-pay | Admitting: Pain Medicine

## 2015-12-13 DIAGNOSIS — E559 Vitamin D deficiency, unspecified: Secondary | ICD-10-CM

## 2015-12-13 LAB — TOXASSURE SELECT 13 (MW), URINE: PDF: 0

## 2015-12-13 MED ORDER — VITAMIN D (ERGOCALCIFEROL) 1.25 MG (50000 UNIT) PO CAPS: ORAL_CAPSULE | ORAL | Status: DC

## 2015-12-13 MED ORDER — MAGNESIUM OXIDE -MG SUPPLEMENT 500 MG PO CAPS: 1.0000 | ORAL_CAPSULE | Freq: Every day | ORAL | Status: DC

## 2015-12-13 MED ORDER — VITAMIN D3 50 MCG (2000 UT) PO CAPS: ORAL_CAPSULE | ORAL | Status: AC

## 2015-12-13 NOTE — Progress Notes (Signed)

## 2015-12-13 NOTE — Progress Notes (Signed)
Quick Note:  Normal Magnesium levels are between 1.6 and 2.3 mEq/L. Low magnesium blood level can lead to low calcium and potassium levels. Low levels may indicate inadequate dietary consuming, poor absorbtion, or excessive excretion. Signs and symptoms may include: leg cramps, foot pain, muscle twitches, loss of appetite, nausea, vomiting, fatigue, weakness, numbness, tingling, seizures, personality changes, abnormal heart rhythms, and/or coronary artery spasms.  ______ 

## 2015-12-13 NOTE — Progress Notes (Signed)
Quick Note:  Lab results reviewed and found to be within normal limits. ______ 

## 2016-03-05 ENCOUNTER — Encounter: Payer: Self-pay | Admitting: Pain Medicine

## 2016-03-05 ENCOUNTER — Ambulatory Visit: Payer: BLUE CROSS/BLUE SHIELD | Attending: Pain Medicine | Admitting: Pain Medicine

## 2016-03-05 VITALS — BP 158/78 | HR 74 | Temp 98.2°F | Resp 16 | Ht <= 58 in | Wt 145.0 lb

## 2016-03-05 DIAGNOSIS — F17210 Nicotine dependence, cigarettes, uncomplicated: Secondary | ICD-10-CM | POA: Insufficient documentation

## 2016-03-05 DIAGNOSIS — M47816 Spondylosis without myelopathy or radiculopathy, lumbar region: Secondary | ICD-10-CM | POA: Insufficient documentation

## 2016-03-05 DIAGNOSIS — M5416 Radiculopathy, lumbar region: Secondary | ICD-10-CM

## 2016-03-05 DIAGNOSIS — K5900 Constipation, unspecified: Secondary | ICD-10-CM | POA: Insufficient documentation

## 2016-03-05 DIAGNOSIS — R1013 Epigastric pain: Secondary | ICD-10-CM | POA: Insufficient documentation

## 2016-03-05 DIAGNOSIS — G5601 Carpal tunnel syndrome, right upper limb: Secondary | ICD-10-CM | POA: Diagnosis not present

## 2016-03-05 DIAGNOSIS — F329 Major depressive disorder, single episode, unspecified: Secondary | ICD-10-CM | POA: Insufficient documentation

## 2016-03-05 DIAGNOSIS — I10 Essential (primary) hypertension: Secondary | ICD-10-CM | POA: Diagnosis not present

## 2016-03-05 DIAGNOSIS — R102 Pelvic and perineal pain: Secondary | ICD-10-CM | POA: Diagnosis not present

## 2016-03-05 DIAGNOSIS — E119 Type 2 diabetes mellitus without complications: Secondary | ICD-10-CM | POA: Insufficient documentation

## 2016-03-05 DIAGNOSIS — N949 Unspecified condition associated with female genital organs and menstrual cycle: Secondary | ICD-10-CM

## 2016-03-05 DIAGNOSIS — G8929 Other chronic pain: Secondary | ICD-10-CM | POA: Diagnosis not present

## 2016-03-05 DIAGNOSIS — M25559 Pain in unspecified hip: Secondary | ICD-10-CM

## 2016-03-05 DIAGNOSIS — M545 Low back pain: Secondary | ICD-10-CM | POA: Diagnosis not present

## 2016-03-05 DIAGNOSIS — E039 Hypothyroidism, unspecified: Secondary | ICD-10-CM | POA: Diagnosis not present

## 2016-03-05 DIAGNOSIS — E041 Nontoxic single thyroid nodule: Secondary | ICD-10-CM | POA: Diagnosis not present

## 2016-03-05 DIAGNOSIS — M1990 Unspecified osteoarthritis, unspecified site: Secondary | ICD-10-CM | POA: Diagnosis not present

## 2016-03-05 DIAGNOSIS — E559 Vitamin D deficiency, unspecified: Secondary | ICD-10-CM | POA: Insufficient documentation

## 2016-03-05 DIAGNOSIS — M797 Fibromyalgia: Secondary | ICD-10-CM | POA: Diagnosis not present

## 2016-03-05 DIAGNOSIS — K660 Peritoneal adhesions (postprocedural) (postinfection): Secondary | ICD-10-CM | POA: Diagnosis not present

## 2016-03-05 DIAGNOSIS — M79606 Pain in leg, unspecified: Secondary | ICD-10-CM | POA: Diagnosis not present

## 2016-03-05 DIAGNOSIS — Z79891 Long term (current) use of opiate analgesic: Secondary | ICD-10-CM | POA: Diagnosis not present

## 2016-03-05 DIAGNOSIS — Z5181 Encounter for therapeutic drug level monitoring: Secondary | ICD-10-CM | POA: Diagnosis not present

## 2016-03-05 DIAGNOSIS — Z6829 Body mass index (BMI) 29.0-29.9, adult: Secondary | ICD-10-CM | POA: Insufficient documentation

## 2016-03-05 DIAGNOSIS — E663 Overweight: Secondary | ICD-10-CM | POA: Insufficient documentation

## 2016-03-05 DIAGNOSIS — R109 Unspecified abdominal pain: Secondary | ICD-10-CM

## 2016-03-05 DIAGNOSIS — R1084 Generalized abdominal pain: Secondary | ICD-10-CM

## 2016-03-05 DIAGNOSIS — M549 Dorsalgia, unspecified: Secondary | ICD-10-CM | POA: Diagnosis present

## 2016-03-05 MED ORDER — OXYCODONE-ACETAMINOPHEN 10-325 MG PO TABS: 1.0000 | ORAL_TABLET | Freq: Three times a day (TID) | ORAL | Status: DC | PRN

## 2016-03-05 MED ORDER — OXYCODONE-ACETAMINOPHEN 10-325 MG PO TABS
1.0000 | ORAL_TABLET | Freq: Three times a day (TID) | ORAL | Status: DC | PRN
Start: 2016-03-05 — End: 2016-06-04

## 2016-03-05 NOTE — Patient Instructions (Addendum)
A prescription for OXYCODONE X 3 was given to you today.  Smoking Cessation, Tips for Success If you are ready to quit smoking, congratulations! You have chosen to help yourself be healthier. Cigarettes bring nicotine, tar, carbon monoxide, and other irritants into your body. Your lungs, heart, and blood vessels will be able to work better without these poisons. There are many different ways to quit smoking. Nicotine gum, nicotine patches, a nicotine inhaler, or nicotine nasal spray can help with physical craving. Hypnosis, support groups, and medicines help break the habit of smoking. WHAT THINGS CAN I DO TO MAKE QUITTING EASIER?  Here are some tips to help you quit for good:  Pick a date when you will quit smoking completely. Tell all of your friends and family about your plan to quit on that date.  Do not try to slowly cut down on the number of cigarettes you are smoking. Pick a quit date and quit smoking completely starting on that day.  Throw away all cigarettes.   Clean and remove all ashtrays from your home, work, and car.  On a card, write down your reasons for quitting. Carry the card with you and read it when you get the urge to smoke.  Cleanse your body of nicotine. Drink enough water and fluids to keep your urine clear or pale yellow. Do this after quitting to flush the nicotine from your body.  Learn to predict your moods. Do not let a bad situation be your excuse to have a cigarette. Some situations in your life might tempt you into wanting a cigarette.  Never have "just one" cigarette. It leads to wanting another and another. Remind yourself of your decision to quit.  Change habits associated with smoking. If you smoked while driving or when feeling stressed, try other activities to replace smoking. Stand up when drinking your coffee. Brush your teeth after eating. Sit in a different chair when you read the paper. Avoid alcohol while trying to quit, and try to drink fewer  caffeinated beverages. Alcohol and caffeine may urge you to smoke.  Avoid foods and drinks that can trigger a desire to smoke, such as sugary or spicy foods and alcohol.  Ask people who smoke not to smoke around you.  Have something planned to do right after eating or having a cup of coffee. For example, plan to take a walk or exercise.  Try a relaxation exercise to calm you down and decrease your stress. Remember, you may be tense and nervous for the first 2 weeks after you quit, but this will pass.  Find new activities to keep your hands busy. Play with a pen, coin, or rubber band. Doodle or draw things on paper.  Brush your teeth right after eating. This will help cut down on the craving for the taste of tobacco after meals. You can also try mouthwash.   Use oral substitutes in place of cigarettes. Try using lemon drops, carrots, cinnamon sticks, or chewing gum. Keep them handy so they are available when you have the urge to smoke.  When you have the urge to smoke, try deep breathing.  Designate your home as a nonsmoking area.  If you are a heavy smoker, ask your health care provider about a prescription for nicotine chewing gum. It can ease your withdrawal from nicotine.  Reward yourself. Set aside the cigarette money you save and buy yourself something nice.  Look for support from others. Join a support group or smoking cessation program. Ask  someone at home or at work to help you with your plan to quit smoking.  Always ask yourself, "Do I need this cigarette or is this just a reflex?" Tell yourself, "Today, I choose not to smoke," or "I do not want to smoke." You are reminding yourself of your decision to quit.  Do not replace cigarette smoking with electronic cigarettes (commonly called e-cigarettes). The safety of e-cigarettes is unknown, and some may contain harmful chemicals.  If you relapse, do not give up! Plan ahead and think about what you will do the next time you get  the urge to smoke. HOW WILL I FEEL WHEN I QUIT SMOKING? You may have symptoms of withdrawal because your body is used to nicotine (the addictive substance in cigarettes). You may crave cigarettes, be irritable, feel very hungry, cough often, get headaches, or have difficulty concentrating. The withdrawal symptoms are only temporary. They are strongest when you first quit but will go away within 10-14 days. When withdrawal symptoms occur, stay in control. Think about your reasons for quitting. Remind yourself that these are signs that your body is healing and getting used to being without cigarettes. Remember that withdrawal symptoms are easier to treat than the major diseases that smoking can cause.  Even after the withdrawal is over, expect periodic urges to smoke. However, these cravings are generally short lived and will go away whether you smoke or not. Do not smoke! WHAT RESOURCES ARE AVAILABLE TO HELP ME QUIT SMOKING? Your health care provider can direct you to community resources or hospitals for support, which may include:  Group support.  Education.  Hypnosis.  Therapy.   This information is not intended to replace advice given to you by your health care provider. Make sure you discuss any questions you have with your health care provider.   Document Released: 06/13/2004 Document Revised: 10/06/2014 Document Reviewed: 03/03/2013 Elsevier Interactive Patient Education [employer].

## 2016-03-05 NOTE — Progress Notes (Signed)
Safety precautions to be maintained throughout the outpatient stay will include: orient to surroundings, keep bed in low position, maintain call bell within reach at all times, provide assistance with transfer out of bed and ambulation. Oxycodone pill count # 0/90  Filled 02-04-16

## 2016-03-05 NOTE — Progress Notes (Signed)
Patient's Name: Vicki Fox  Patient type: Established  MRN: [id number]  Service setting: Ambulatory outpatient  DOB: 1966-05-29  Location: [facility]  DOS: 03/05/2016  Primary Care Physician: Christie Nottingham., PA  Note by: Kathlen Brunswick. Dossie Arbour, M.D, DABA, DABAPM, DABPM, DABIPP, FIPP  Referring Physician: Christie Nottingham, PA  Specialty: Board-Certified Interventional Pain Management  Last Visit to Pain Management: 12/06/2015   Primary Reason(s) for Visit: Encounter for prescription drug management (Level of risk: moderate) CC: Back Pain and Abdominal Pain   HPI  Vicki Fox is a 50 y.o. year old, female patient, who returns today as an established patient. She has Fibromyalgia; Essential hypertension; Hypothyroidism; Vitamin D deficiency; Chronic abdominal pain (Location of Secondary source of pain) (Epigastric); CN (constipation); Overweight (BMI 25.0-29.9); FH: diabetes mellitus; Chronic pain; Long term current use of opiate analgesic; Long term prescription opiate use; Opiate use (45 MME/Day); Opioid dependence, daily use ([place]); Encounter for therapeutic drug level monitoring; Encounter for chronic pain management; Depression; Gallbladder disease; Adhesions Pelvic; Generalized abdominal pain (Location of Secondary source of pain) (Epigastric); Adhesions Abdominal; Chronic pelvic pain in female; Chronic low back pain (Location of Primary Source of Pain) (Bilateral) (L>R); Lumbar spondylosis; Chronic lower extremity pain (Bilateral) (L>R); Chronic lumbar radicular pain; Osteoarthritis of hip (Location of Tertiary source of pain) (Bilateral) (L>R); Chronic hip pain (Location of Tertiary source of pain) (Bilateral) (L>R); CRP elevated; Thyroid nodule; COPD with emphysema ([place]); Nicotine dependence; Hypomagnesemia; Tobacco abuse; Neuropathy ([place]); Neurogenic pain; Neuropathic pain; Right carpal tunnel syndrome; and Lumbar facet syndrome (Location of Primary Source of Pain)  (Bilateral) (L>R) on her problem list.. Her primarily concern today is the Back Pain and Abdominal Pain   Pain Assessment: Self-Reported Pain Score: 3  Reported level is compatible with observation Pain Type: Chronic pain Pain Location: Abdomen (back) Pain Orientation: Mid Pain Descriptors / Indicators: Shooting, Stabbing Pain Frequency: Constant  The patient comes into the clinics today for pharmacological management of her chronic pain. I last saw this patient on 12/06/2015. The patient  reports that she does not use illicit drugs. Her body mass index is 30.31 kg/(m^2).  Date of Last Visit: 12/06/15 Service Provided on Last Visit: Med Refill  Controlled Substance Pharmacotherapy Assessment & REMS (Risk Evaluation and Mitigation Strategy)  Analgesic: Oxycodone/APAP 10/325 one tablet every 8 hours when necessary (30 mg/day) Pill Count: Oxycodone pill count # 0/90 Filled 02-04-16 MME/day: 45 mg/day Pharmacokinetics: Onset of action (Liberation/Absorption): Within expected pharmacological parameters Time to Peak effect (Distribution): Timing and results are as within normal expected parameters Duration of action (Metabolism/Excretion): Within normal limits for medication Pharmacodynamics: Analgesic Effect: More than 50% Activity Facilitation: Medication(s) allow patient to sit, stand, walk, and do the basic ADLs Perceived Effectiveness: Described as relatively effective, allowing for increase in activities of daily living (ADL) Side-effects or Adverse reactions: None reported Monitoring: [place] PMP: Online review of the past 64-month period conducted. Compliant with practice rules and regulations Last UDS on record: TOXASSURE SELECT 13  Date Value Ref Range Status  12/06/2015 FINAL  Final    Comment:    ==================================================================== TOXASSURE SELECT 13 (MW) ==================================================================== Test                              Result       Flag       Units Drug Present and Declared for Prescription Verification   Oxycodone  5247         EXPECTED   ng/mg creat   Oxymorphone                    3502         EXPECTED   ng/mg creat   Noroxycodone                   >5291        EXPECTED   ng/mg creat   Noroxymorphone                 470          EXPECTED   ng/mg creat    Sources of oxycodone are scheduled prescription medications.    Oxymorphone, noroxycodone, and noroxymorphone are expected    metabolites of oxycodone. Oxymorphone is also available as a    scheduled prescription medication. ==================================================================== Test                      Result    Flag   Units      Ref Range   Creatinine              189              mg/dL      >=20 ==================================================================== Declared Medications:  The flagging and interpretation on this report are based on the  following declared medications.  Unexpected results may arise from  inaccuracies in the declared medications.  **Note: The testing scope of this panel includes these medications:  Oxycodone (Percocet)  **Note: The testing scope of this panel does not include following  reported medications:  Acetaminophen (Percocet)  Amitriptyline (Elavil)  Azithromycin (Zithromax)  Fluticasone (Flonase)  Hydrochlorothiazide (Hydrodiuril)  Ibuprofen  Levothyroxine  Meloxicam (Mobic)  Polyethylene Glycol  Prednisone  Vitamin D3 ==================================================================== For clinical consultation, please call [phone]. ====================================================================    UDS interpretation: Compliant Medication Assessment Form: Reviewed. Patient indicates being compliant with therapy Treatment compliance: Compliant Risk Assessment: Aberrant Behavior: None observed today Substance Use Disorder (SUD) Risk Level:  Low-to-moderate Risk of opioid abuse or dependence: 0.7-3.0% with doses ? 36 MME/day and 6.1-26% with doses ? 120 MME/day. Opioid Risk Tool (ORT) Score: Total Score: 0 Low Risk for SUD (Score <3) Depression Scale Score: PHQ-2: PHQ-2 Total Score: 0 No depression (0) PHQ-9: PHQ-9 Total Score: 0 No depression (0-4)  Pharmacologic Plan: No change in therapy, at this time  Laboratory Chemistry  Inflammation Markers Lab Results  Component Value Date   ESRSEDRATE 3 12/06/2015   CRP <0.5 12/06/2015    Renal Function Lab Results  Component Value Date   BUN 13 12/06/2015   CREATININE 0.75 12/06/2015   GFRAA >60 12/06/2015   GFRNONAA >60 12/06/2015    Hepatic Function Lab Results  Component Value Date   AST 19 12/06/2015   ALT 17 12/06/2015   ALBUMIN 4.6 12/06/2015    Electrolytes Lab Results  Component Value Date   NA 138 12/06/2015   K 3.7 12/06/2015   CL 101 12/06/2015   CALCIUM 9.6 12/06/2015   MG 1.6* 12/06/2015    Pain Modulating Vitamins Lab Results  Component Value Date   VD25OH 20.9* 12/06/2015   VD125OH2TOT 28.6 12/06/2015   VITAMINB12 306 12/06/2015    Coagulation Parameters Lab Results  Component Value Date   PLT 192 05/17/2013    Note: Labs reviewed. Results made available to patient  Recent Diagnostic Imaging  Lumbar Imaging:  Lumbar DG Bending views:  Results for orders placed in visit on 08/11/14  DG Lumbar Spine Complete W/Bend   Narrative * PRIOR REPORT IMPORTED FROM AN EXTERNAL SYSTEM *   CLINICAL DATA:  Chronic low back pain.   EXAM:  LUMBAR SPINE - COMPLETE WITH BENDING VIEWS   COMPARISON:  None.   FINDINGS:  Five non rib-bearing lumbar-type vertebral bodies are present. Facet  degenerative changes at L5-S1 and L4-5 are worse on the left.  Vertebral body heights and alignment are normal. Mild endplate  degenerative changes are present at L1-2. There is no abnormal  motion with standing, flexion, or extension. Surgical clips are   present in the gallbladder fossa.   IMPRESSION:  1. Mild facet degenerative changes within the lower lumbar spine.  2. Slight loss of disc height and endplate degenerative change at  L1-2 without significant listhesis or stenosis.    Electronically Signed    By: Lawrence Santiago M.D.    On: 08/11/2014 14:50       Meds  The patient has a current medication list which includes the following prescription(s): amitriptyline, fluticasone, hydrochlorothiazide, ibuprofen, levothyroxine, magnesium oxide, oxycodone-acetaminophen, oxycodone-acetaminophen, oxycodone-acetaminophen, polyethylene glycol powder, and vitamin d3.  Current Outpatient Prescriptions on File Prior to Visit  Medication Sig  . amitriptyline (ELAVIL) 25 MG tablet Take 1 tablet (25 mg total) by mouth at bedtime.  . fluticasone (FLONASE) 50 MCG/ACT nasal spray instill 1 to 2 sprays into each nostril once daily  . hydrochlorothiazide (HYDRODIURIL) 25 MG tablet Take 1 tablet by mouth daily at 2 PM daily at 2 PM.  . ibuprofen (ADVIL,MOTRIN) 200 MG tablet Take 800 mg by mouth every 8 (eight) hours as needed.  Marland Kitchen levothyroxine (SYNTHROID, LEVOTHROID) 100 MCG tablet Take 100 mcg by mouth daily before breakfast.  . Magnesium Oxide 500 MG CAPS Take 1 capsule (500 mg total) by mouth daily.  . polyethylene glycol powder (GLYCOLAX/MIRALAX) powder take 17GM (DISSOLVED IN WATER) by mouth once daily  . Cholecalciferol (VITAMIN D3) 2000 units capsule Take 1 capsule (2,000 Units total) by mouth daily.   No current facility-administered medications on file prior to visit.    ROS  Constitutional: Denies any fever or chills Gastrointestinal: No reported hemesis, hematochezia, vomiting, or acute GI distress Musculoskeletal: Denies any acute onset joint swelling, redness, loss of ROM, or weakness Neurological: No reported episodes of acute onset apraxia, aphasia, dysarthria, agnosia, amnesia, paralysis, loss of coordination, or loss of  consciousness  Allergies  Ms. Stennett has No Known Allergies.  [place]  Medical:  Ms. Canez  has a past medical history of Arthritis and Asthma. Family: family history includes Diabetes in her father and mother; Heart disease in her father. Surgical:  has past surgical history that includes Cesarean section; Cholecystectomy; and Tubal ligation. Tobacco:  reports that she has been smoking Cigarettes.  She has a 30 pack-year smoking history. She does not have any smokeless tobacco history on file. Alcohol:  reports that she drinks alcohol. Drug:  reports that she does not use illicit drugs.  Constitutional Exam  Vitals: Blood pressure 158/78, pulse 74, temperature 98.2 F (36.8 C), resp. rate 16, height 4\' 10"  (1.473 m), weight 145 lb (65.772 kg), SpO2 100 %. General appearance: Well nourished, well developed, and well hydrated. In no acute distress Calculated BMI/Body habitus: Body mass index is 30.31 kg/(m^2). (25-29.9 kg/m2) Overweight - 20% higher incidence of chronic pain Psych/Mental status: Alert and oriented x 3 (person, place, & time) Eyes: PERLA Respiratory: No  evidence of acute respiratory distress  Cervical Spine Exam  Inspection: No masses, redness, or swelling Alignment: Symmetrical ROM: Functional: ROM is within functional limits [facility]) Stability: No instability detected Muscle strength & Tone: Functionally intact Sensory: Unimpaired Palpation: No complaints of tenderness  Upper Extremity (UE) Exam    Side: Right upper extremity  Side: Left upper extremity  Inspection: No masses, redness, swelling, or asymmetry  Inspection: No masses, redness, swelling, or asymmetry  ROM:  ROM:  Functional: ROM is within functional limits [facility])  Functional: ROM is within functional limits [facility])  Muscle strength & Tone: Functionally intact  Muscle strength & Tone: Functionally intact  Sensory: Unimpaired  Sensory: Unimpaired  Palpation: Non-contributory  Palpation: Non-contributory    Thoracic Spine Exam  Inspection: No masses, redness, or swelling Alignment: Symmetrical ROM: Functional: ROM is within functional limits [facility]) Stability: No instability detected Sensory: Unimpaired Muscle strength & Tone: Functionally intact Palpation: No complaints of tenderness  Lumbar Spine Exam  Inspection: No masses, redness, or swelling Alignment: Symmetrical ROM: Functional: ROM is within functional limits [facility]) Stability: No instability detected Muscle strength & Tone: Functionally intact Sensory: Unimpaired Palpation: No complaints of tenderness Provocative Tests: Lumbar Hyperextension and rotation test: deferred Patrick's Maneuver: deferred  Gait & Posture Assessment  Ambulation: Unassisted Gait: Unaffected Posture: WNL  Lower Extremity Exam    Side: Right lower extremity  Side: Left lower extremity  Inspection: No masses, redness, swelling, or asymmetry ROM:  Inspection: No masses, redness, swelling, or asymmetry ROM:  Functional: ROM is within functional limits [facility])  Functional: ROM is within functional limits [facility])  Muscle strength & Tone: Functionally intact  Muscle strength & Tone: Functionally intact  Sensory: Unimpaired  Sensory: Unimpaired  Palpation: Non-contributory  Palpation: Non-contributory   Assessment & Plan  Primary Diagnosis & Pertinent Problem List: The primary encounter diagnosis was Chronic pain. Diagnoses of Encounter for therapeutic drug level monitoring, Long term current use of opiate analgesic, Lumbar facet syndrome (Location of Primary Source of Pain) (Bilateral) (L>R), Chronic low back pain (Location of Primary Source of Pain) (Bilateral) (L>R), Chronic hip pain, unspecified laterality, Chronic abdominal pain (Location of Secondary source of pain) (Epigastric), Chronic pain of lower extremity, unspecified laterality, Chronic lumbar radicular pain, Chronic pelvic pain in female, Generalized abdominal pain (Location of Secondary source  of pain) (Epigastric), Lumbar spondylosis, unspecified spinal osteoarthritis, and Right carpal tunnel syndrome were also pertinent to this visit.  Visit Diagnosis: 1. Chronic pain   2. Encounter for therapeutic drug level monitoring   3. Long term current use of opiate analgesic   4. Lumbar facet syndrome (Location of Primary Source of Pain) (Bilateral) (L>R)   5. Chronic low back pain (Location of Primary Source of Pain) (Bilateral) (L>R)   6. Chronic hip pain, unspecified laterality   7. Chronic abdominal pain (Location of Secondary source of pain) (Epigastric)   8. Chronic pain of lower extremity, unspecified laterality   9. Chronic lumbar radicular pain   10. Chronic pelvic pain in female   32. Generalized abdominal pain (Location of Secondary source of pain) (Epigastric)   12. Lumbar spondylosis, unspecified spinal osteoarthritis   13. Right carpal tunnel syndrome     Problems updated and reviewed during this visit: No problems updated.  Problem-specific Plan(s): No problem-specific assessment & plan notes found for this encounter.  No new assessment & plan notes have been filed under this hospital service since the last note was generated. Service: Pain Management   Plan of Care   Problem  List Items Addressed This Visit      High   Chronic abdominal pain (Location of Secondary source of pain) (Epigastric) (Chronic)   Relevant Medications   oxyCODONE-acetaminophen (PERCOCET) 10-325 MG tablet   oxyCODONE-acetaminophen (PERCOCET) 10-325 MG tablet   oxyCODONE-acetaminophen (PERCOCET) 10-325 MG tablet   Other Relevant Orders   CELIAC PLEXUS BLOCK   Chronic hip pain (Location of Tertiary source of pain) (Bilateral) (L>R) (Chronic)   Relevant Medications   oxyCODONE-acetaminophen (PERCOCET) 10-325 MG tablet   oxyCODONE-acetaminophen (PERCOCET) 10-325 MG tablet   oxyCODONE-acetaminophen (PERCOCET) 10-325 MG tablet   Other Relevant Orders   HIP INJECTION   Chronic low back  pain (Location of Primary Source of Pain) (Bilateral) (L>R) (Chronic)   Relevant Medications   oxyCODONE-acetaminophen (PERCOCET) 10-325 MG tablet   oxyCODONE-acetaminophen (PERCOCET) 10-325 MG tablet   oxyCODONE-acetaminophen (PERCOCET) 10-325 MG tablet   Other Relevant Orders   LUMBAR EPIDURAL STEROID INJECTION   LUMBAR FACET(MEDIAL BRANCH NERVE BLOCK) MBNB   Chronic lower extremity pain (Bilateral) (L>R) (Chronic)   Chronic lumbar radicular pain (Chronic)   Relevant Orders   LUMBAR EPIDURAL STEROID INJECTION   Chronic pain - Primary (Chronic)   Relevant Medications   oxyCODONE-acetaminophen (PERCOCET) 10-325 MG tablet   oxyCODONE-acetaminophen (PERCOCET) 10-325 MG tablet   oxyCODONE-acetaminophen (PERCOCET) 10-325 MG tablet   Chronic pelvic pain in female (Chronic)   Relevant Medications   oxyCODONE-acetaminophen (PERCOCET) 10-325 MG tablet   oxyCODONE-acetaminophen (PERCOCET) 10-325 MG tablet   oxyCODONE-acetaminophen (PERCOCET) 10-325 MG tablet   Other Relevant Orders   HYPOGASTRIC BLOCK   Generalized abdominal pain (Location of Secondary source of pain) (Epigastric) (Chronic)   Relevant Orders   CELIAC PLEXUS BLOCK   Lumbar facet syndrome (Location of Primary Source of Pain) (Bilateral) (L>R) (Chronic)   Relevant Medications   oxyCODONE-acetaminophen (PERCOCET) 10-325 MG tablet   oxyCODONE-acetaminophen (PERCOCET) 10-325 MG tablet   oxyCODONE-acetaminophen (PERCOCET) 10-325 MG tablet   Other Relevant Orders   LUMBAR FACET(MEDIAL BRANCH NERVE BLOCK) MBNB   Lumbar spondylosis (Chronic)   Relevant Medications   oxyCODONE-acetaminophen (PERCOCET) 10-325 MG tablet   oxyCODONE-acetaminophen (PERCOCET) 10-325 MG tablet   oxyCODONE-acetaminophen (PERCOCET) 10-325 MG tablet   Other Relevant Orders   LUMBAR FACET(MEDIAL BRANCH NERVE BLOCK) MBNB   Right carpal tunnel syndrome (Chronic)   Relevant Orders   Therapeutic injection carpal tunnel     Medium   Encounter for  therapeutic drug level monitoring   Long term current use of opiate analgesic (Chronic)   Relevant Orders   ToxASSURE Select 13 (MW), Urine       Pharmacotherapy (Medications Ordered): Meds ordered this encounter  Medications  . oxyCODONE-acetaminophen (PERCOCET) 10-325 MG tablet    Sig: Take 1 tablet by mouth every 8 (eight) hours as needed for pain.    Dispense:  90 tablet    Refill:  0    Do not place this medication, or any other prescription from our practice, on "Automatic Refill". Patient may have prescription filled one day early if pharmacy is closed on scheduled refill date. Do not fill until: 03/05/16 To last until: 04/04/16  . oxyCODONE-acetaminophen (PERCOCET) 10-325 MG tablet    Sig: Take 1 tablet by mouth every 8 (eight) hours as needed for pain.    Dispense:  90 tablet    Refill:  0    Do not place this medication, or any other prescription from our practice, on "Automatic Refill". Patient may have prescription filled one day early if pharmacy is closed on scheduled  refill date. Do not fill until: 04/04/16 To last until: 05/04/16  . oxyCODONE-acetaminophen (PERCOCET) 10-325 MG tablet    Sig: Take 1 tablet by mouth every 8 (eight) hours as needed for pain.    Dispense:  90 tablet    Refill:  0    Do not place this medication, or any other prescription from our practice, on "Automatic Refill". Patient may have prescription filled one day early if pharmacy is closed on scheduled refill date. Do not fill until: 05/04/16 To last until: 06/03/16    [facility] & Procedure Ordered: Orders Placed This Encounter  Procedures  . CELIAC PLEXUS BLOCK  . HYPOGASTRIC BLOCK  . LUMBAR EPIDURAL STEROID INJECTION  . LUMBAR FACET(MEDIAL BRANCH NERVE BLOCK) MBNB  . HIP INJECTION  . Therapeutic injection carpal tunnel  . ToxASSURE Select 13 (MW), Urine    Imaging Ordered: None  Interventional Therapies: Scheduled:  None at this time.    Considering:   1. Diagnostic,  bilateral celiac plexus block, under fluoroscopic guidance and IV sedation.  2. Diagnostic bilateral intra-articular hip joint injection under fluoroscopic guidance, with or without sedation.  3. Diagnostic bilateral lumbar facet block under fluoroscopic guidance and IV sedation.  4. Possible bilateral lumbar facet radiofrequency ablation. 5. Diagnostic left sided lumbar epidural steroid injection under fluoroscopic guidance, with or without IV sedation.  6. Diagnostic superior hypogastric nerve block under fluoroscopic guidance and IV sedation.  7. Diagnostic right-sided carpal tunnel injection without fluoroscopy or IV sedation.    PRN Procedures:   1. Diagnostic, bilateral celiac plexus block, under fluoroscopic guidance and IV sedation.  2. Diagnostic bilateral intra-articular hip joint injection under fluoroscopic guidance, with or without sedation.  3. Diagnostic bilateral lumbar facet block under fluoroscopic guidance and IV sedation.  4. Diagnostic left sided lumbar epidural steroid injection under fluoroscopic guidance, with or without IV sedation.  5. Diagnostic superior hypogastric nerve block under fluoroscopic guidance and IV sedation.  6. Diagnostic right-sided carpal tunnel injection without fluoroscopy or IV sedation.    Referral(s) or Consult(s): None at this time.  New Prescriptions   No medications on file    Medications administered during this visit: Ms. Eiler had no medications administered during this visit.  Requested PM Follow-up: Return in about 2 months (around 05/19/2016) for Medication Management, (3-Mo), Procedure (PRN - Patient will call).  Future Appointments Date Time Provider [place]  05/19/2016 9:20 AM Milinda Pointer, MD [facility] None    Primary Care Physician: Christie Nottingham., PA Location: [facility] Note by: Kathlen Brunswick Dossie Arbour, M.D, DABA, DABAPM, DABPM, DABIPP, FIPP  Pain Score Disclaimer: We use  the NRS-11 scale. This is a self-reported, subjective measurement of pain severity with only modest accuracy. It is used primarily to identify changes within a particular patient. It must be understood that outpatient pain scales are significantly less accurate that those used for research, where they can be applied under ideal controlled circumstances with minimal exposure to variables. In reality, the score is likely to be a combination of pain intensity and pain affect, where pain affect describes the degree of emotional arousal or changes in action readiness caused by the sensory experience of pain. Factors such as social and work situation, setting, emotional state, anxiety levels, expectation, and prior pain experience may influence pain perception and show large inter-individual differences that may also be affected by time variables.  Patient instructions provided during this appointment: Patient Instructions  A prescription for OXYCODONE X 3 was given to  you today.  Smoking Cessation, Tips for Success If you are ready to quit smoking, congratulations! You have chosen to help yourself be healthier. Cigarettes bring nicotine, tar, carbon monoxide, and other irritants into your body. Your lungs, heart, and blood vessels will be able to work better without these poisons. There are many different ways to quit smoking. Nicotine gum, nicotine patches, a nicotine inhaler, or nicotine nasal spray can help with physical craving. Hypnosis, support groups, and medicines help break the habit of smoking. WHAT THINGS CAN I DO TO MAKE QUITTING EASIER?  Here are some tips to help you quit for good:  Pick a date when you will quit smoking completely. Tell all of your friends and family about your plan to quit on that date.  Do not try to slowly cut down on the number of cigarettes you are smoking. Pick a quit date and quit smoking completely starting on that day.  Throw away all cigarettes.   Clean and  remove all ashtrays from your home, work, and car.  On a card, write down your reasons for quitting. Carry the card with you and read it when you get the urge to smoke.  Cleanse your body of nicotine. Drink enough water and fluids to keep your urine clear or pale yellow. Do this after quitting to flush the nicotine from your body.  Learn to predict your moods. Do not let a bad situation be your excuse to have a cigarette. Some situations in your life might tempt you into wanting a cigarette.  Never have "just one" cigarette. It leads to wanting another and another. Remind yourself of your decision to quit.  Change habits associated with smoking. If you smoked while driving or when feeling stressed, try other activities to replace smoking. Stand up when drinking your coffee. Brush your teeth after eating. Sit in a different chair when you read the paper. Avoid alcohol while trying to quit, and try to drink fewer caffeinated beverages. Alcohol and caffeine may urge you to smoke.  Avoid foods and drinks that can trigger a desire to smoke, such as sugary or spicy foods and alcohol.  Ask people who smoke not to smoke around you.  Have something planned to do right after eating or having a cup of coffee. For example, plan to take a walk or exercise.  Try a relaxation exercise to calm you down and decrease your stress. Remember, you may be tense and nervous for the first 2 weeks after you quit, but this will pass.  Find new activities to keep your hands busy. Play with a pen, coin, or rubber band. Doodle or draw things on paper.  Brush your teeth right after eating. This will help cut down on the craving for the taste of tobacco after meals. You can also try mouthwash.   Use oral substitutes in place of cigarettes. Try using lemon drops, carrots, cinnamon sticks, or chewing gum. Keep them handy so they are available when you have the urge to smoke.  When you have the urge to smoke, try deep  breathing.  Designate your home as a nonsmoking area.  If you are a heavy smoker, ask your health care provider about a prescription for nicotine chewing gum. It can ease your withdrawal from nicotine.  Reward yourself. Set aside the cigarette money you save and buy yourself something nice.  Look for support from others. Join a support group or smoking cessation program. Ask someone at home or at work to help  you with your plan to quit smoking.  Always ask yourself, "Do I need this cigarette or is this just a reflex?" Tell yourself, "Today, I choose not to smoke," or "I do not want to smoke." You are reminding yourself of your decision to quit.  Do not replace cigarette smoking with electronic cigarettes (commonly called e-cigarettes). The safety of e-cigarettes is unknown, and some may contain harmful chemicals.  If you relapse, do not give up! Plan ahead and think about what you will do the next time you get the urge to smoke. HOW WILL I FEEL WHEN I QUIT SMOKING? You may have symptoms of withdrawal because your body is used to nicotine (the addictive substance in cigarettes). You may crave cigarettes, be irritable, feel very hungry, cough often, get headaches, or have difficulty concentrating. The withdrawal symptoms are only temporary. They are strongest when you first quit but will go away within 10-14 days. When withdrawal symptoms occur, stay in control. Think about your reasons for quitting. Remind yourself that these are signs that your body is healing and getting used to being without cigarettes. Remember that withdrawal symptoms are easier to treat than the major diseases that smoking can cause.  Even after the withdrawal is over, expect periodic urges to smoke. However, these cravings are generally short lived and will go away whether you smoke or not. Do not smoke! WHAT RESOURCES ARE AVAILABLE TO HELP ME QUIT SMOKING? Your health care provider can direct you to community resources or  hospitals for support, which may include:  Group support.  Education.  Hypnosis.  Therapy.   This information is not intended to replace advice given to you by your health care provider. Make sure you discuss any questions you have with your health care provider.   Document Released: 06/13/2004 Document Revised: 10/06/2014 Document Reviewed: 03/03/2013 Elsevier Interactive Patient Education [employer].

## 2016-03-13 LAB — TOXASSURE SELECT 13 (MW), URINE: PDF: 0

## 2016-03-25 ENCOUNTER — Telehealth: Payer: Self-pay | Admitting: Pain Medicine

## 2016-03-25 NOTE — Telephone Encounter (Signed)
Would like to have Miralax called in to pharmacy please let patient know when she can go pick up

## 2016-03-25 NOTE — Telephone Encounter (Signed)
Spoke with patient to let her know that Dr Dossie Arbour does not call in Rx's over the phone.  Told patient that she could use an OTC laxative or stool softener until her next appt and then discuss if she should do something different at that time.

## 2016-05-19 ENCOUNTER — Encounter: Payer: BLUE CROSS/BLUE SHIELD | Admitting: Pain Medicine

## 2016-06-04 ENCOUNTER — Ambulatory Visit: Payer: BLUE CROSS/BLUE SHIELD | Attending: Pain Medicine | Admitting: Pain Medicine

## 2016-06-04 ENCOUNTER — Encounter: Payer: Self-pay | Admitting: Pain Medicine

## 2016-06-04 VITALS — BP 177/83 | HR 62 | Temp 98.6°F | Resp 16 | Ht <= 58 in | Wt 150.0 lb

## 2016-06-04 DIAGNOSIS — R109 Unspecified abdominal pain: Secondary | ICD-10-CM

## 2016-06-04 DIAGNOSIS — M797 Fibromyalgia: Secondary | ICD-10-CM | POA: Insufficient documentation

## 2016-06-04 DIAGNOSIS — K5903 Drug induced constipation: Secondary | ICD-10-CM | POA: Insufficient documentation

## 2016-06-04 DIAGNOSIS — M25551 Pain in right hip: Secondary | ICD-10-CM | POA: Insufficient documentation

## 2016-06-04 DIAGNOSIS — E119 Type 2 diabetes mellitus without complications: Secondary | ICD-10-CM | POA: Diagnosis not present

## 2016-06-04 DIAGNOSIS — M47816 Spondylosis without myelopathy or radiculopathy, lumbar region: Secondary | ICD-10-CM

## 2016-06-04 DIAGNOSIS — E039 Hypothyroidism, unspecified: Secondary | ICD-10-CM | POA: Insufficient documentation

## 2016-06-04 DIAGNOSIS — E559 Vitamin D deficiency, unspecified: Secondary | ICD-10-CM | POA: Insufficient documentation

## 2016-06-04 DIAGNOSIS — R1084 Generalized abdominal pain: Secondary | ICD-10-CM

## 2016-06-04 DIAGNOSIS — K829 Disease of gallbladder, unspecified: Secondary | ICD-10-CM | POA: Diagnosis not present

## 2016-06-04 DIAGNOSIS — G5601 Carpal tunnel syndrome, right upper limb: Secondary | ICD-10-CM | POA: Diagnosis not present

## 2016-06-04 DIAGNOSIS — J449 Chronic obstructive pulmonary disease, unspecified: Secondary | ICD-10-CM | POA: Diagnosis not present

## 2016-06-04 DIAGNOSIS — F329 Major depressive disorder, single episode, unspecified: Secondary | ICD-10-CM | POA: Diagnosis not present

## 2016-06-04 DIAGNOSIS — R1013 Epigastric pain: Secondary | ICD-10-CM | POA: Diagnosis not present

## 2016-06-04 DIAGNOSIS — R7982 Elevated C-reactive protein (CRP): Secondary | ICD-10-CM | POA: Insufficient documentation

## 2016-06-04 DIAGNOSIS — R102 Pelvic and perineal pain: Secondary | ICD-10-CM | POA: Diagnosis not present

## 2016-06-04 DIAGNOSIS — E663 Overweight: Secondary | ICD-10-CM | POA: Diagnosis not present

## 2016-06-04 DIAGNOSIS — M160 Bilateral primary osteoarthritis of hip: Secondary | ICD-10-CM | POA: Insufficient documentation

## 2016-06-04 DIAGNOSIS — M25559 Pain in unspecified hip: Secondary | ICD-10-CM

## 2016-06-04 DIAGNOSIS — M25552 Pain in left hip: Secondary | ICD-10-CM | POA: Insufficient documentation

## 2016-06-04 DIAGNOSIS — M4726 Other spondylosis with radiculopathy, lumbar region: Secondary | ICD-10-CM | POA: Insufficient documentation

## 2016-06-04 DIAGNOSIS — Z79891 Long term (current) use of opiate analgesic: Secondary | ICD-10-CM | POA: Diagnosis not present

## 2016-06-04 DIAGNOSIS — I10 Essential (primary) hypertension: Secondary | ICD-10-CM | POA: Insufficient documentation

## 2016-06-04 DIAGNOSIS — F17210 Nicotine dependence, cigarettes, uncomplicated: Secondary | ICD-10-CM | POA: Insufficient documentation

## 2016-06-04 DIAGNOSIS — E041 Nontoxic single thyroid nodule: Secondary | ICD-10-CM | POA: Insufficient documentation

## 2016-06-04 DIAGNOSIS — Z6831 Body mass index (BMI) 31.0-31.9, adult: Secondary | ICD-10-CM | POA: Diagnosis not present

## 2016-06-04 DIAGNOSIS — M545 Low back pain: Secondary | ICD-10-CM | POA: Insufficient documentation

## 2016-06-04 DIAGNOSIS — T402X5A Adverse effect of other opioids, initial encounter: Secondary | ICD-10-CM

## 2016-06-04 DIAGNOSIS — G8929 Other chronic pain: Secondary | ICD-10-CM | POA: Insufficient documentation

## 2016-06-04 MED ORDER — OXYCODONE-ACETAMINOPHEN 10-325 MG PO TABS
1.0000 | ORAL_TABLET | Freq: Three times a day (TID) | ORAL | 0 refills | Status: DC | PRN
Start: 2016-06-04 — End: 2016-08-28

## 2016-06-04 MED ORDER — OXYCODONE-ACETAMINOPHEN 10-325 MG PO TABS
1.0000 | ORAL_TABLET | Freq: Three times a day (TID) | ORAL | 0 refills | Status: DC | PRN
Start: 2016-07-04 — End: 2016-08-28

## 2016-06-04 MED ORDER — OXYCODONE-ACETAMINOPHEN 10-325 MG PO TABS
1.0000 | ORAL_TABLET | Freq: Three times a day (TID) | ORAL | 0 refills | Status: DC | PRN
Start: 2016-08-03 — End: 2016-08-28

## 2016-06-04 NOTE — Progress Notes (Signed)
Patient's Name: Vicki Fox  MRN: [id number]  Referring Provider: Christie Nottingham, [place]  DOB: 09-Apr-1966  PCP: Christie Nottingham., PA  DOS: 06/04/2016  Note by: Kathlen Brunswick. Dossie Arbour, MD  Service setting: Ambulatory outpatient  Specialty: Interventional Pain Management  Location: [facility] (AMB) Pain Management Facility    Patient type: Established   Primary Reason(s) for Visit: Encounter for prescription drug management (Level of risk: moderate) CC: Back Pain (lower) and Abdominal Pain   HPI  Vicki Fox is a 50 y.o. year old, female patient, who returns today as an established patient. She has Fibromyalgia; Essential hypertension; Hypothyroidism; Vitamin D deficiency; Chronic abdominal pain (Location of Secondary source of pain) (Epigastric); CN (constipation); Overweight (BMI 25.0-29.9); FH: diabetes mellitus; Chronic pain; Long term current use of opiate analgesic; Long term prescription opiate use; Opiate use (45 MME/Day); Encounter for therapeutic drug level monitoring; Encounter for chronic pain management; Depression; Gallbladder disease; Adhesions Pelvic; Generalized abdominal pain (Location of Secondary source of pain) (Epigastric); Adhesions Abdominal; Chronic pelvic pain in female; Chronic low back pain (Location of Primary Source of Pain) (Bilateral) (L>R); Lumbar spondylosis; Chronic lower extremity pain (Bilateral) (L>R); Chronic lumbar radicular pain; Osteoarthritis of hip (Location of Tertiary source of pain) (Bilateral) (L>R); Chronic hip pain (Location of Tertiary source of pain) (Bilateral) (L>R); CRP elevated; Thyroid nodule; COPD with emphysema ([place]); Nicotine dependence; Hypomagnesemia; Tobacco abuse; Neuropathy ([place]); Neurogenic pain; Neuropathic pain; Right carpal tunnel syndrome; Lumbar facet syndrome (Location of Primary Source of Pain) (Bilateral) (L>R); and Opioid-induced constipation (OIC) on her problem list.. Her primarily concern today is the Back Pain (lower) and Abdominal  Pain   Pain Assessment: Self-Reported Pain Score: 6  Clinically the patient looks like a 2/10 Reported level is inconsistent with clinical obrservations Information on the proper use of the pain score provided to the patient today. Pain Type: Chronic pain Pain Location: Back Pain Orientation: Lower Pain Descriptors / Indicators: Stabbing, Sharp Pain Frequency: Intermittent  The patient comes into the clinics today for pharmacological management of her chronic pain. I last saw this patient on 03/25/2016. The patient  reports that she does not use drugs. Her body mass index is 31.35 kg/m.  Date of Last Visit: 03/05/16 Service Provided on Last Visit: Med Refill  Controlled Substance Pharmacotherapy Assessment & REMS (Risk Evaluation and Mitigation Strategy)  Analgesic: Oxycodone/APAP 10/325 one tablet every 8 hours when necessary (30 mg/day) MME/day: 45 mg/day Pill Count: Oxycodone 10/325 mg 0 out of 90 remaining. Filled 05-04-16. Pharmacokinetics: Onset of action (Liberation/Absorption): Within expected pharmacological parameters Time to Peak effect (Distribution): Timing and results are as within normal expected parameters Duration of action (Metabolism/Excretion): Within normal limits for medication Pharmacodynamics: Analgesic Effect: More than 50% Activity Facilitation: Medication(s) allow patient to sit, stand, walk, and do the basic ADLs Perceived Effectiveness: Described as relatively effective, allowing for increase in activities of daily living (ADL) Side-effects or Adverse reactions: None reported Monitoring: [place] PMP: Online review of the past 96-month period conducted. Compliant with practice rules and regulations Last UDS on record: [employer]ox[employer] Select 13  Date Value Ref Range Status  03/05/2016 FINAL  Final    Comment:    ==================================================================== TOXASSURE SELECT 13  (MW) ==================================================================== Test                             Result       Flag       Units Drug Present and Declared for Prescription Verification  Oxycodone                      2828         EXPECTED   ng/mg creat   Oxymorphone                    5114         EXPECTED   ng/mg creat   Noroxycodone                   7706         EXPECTED   ng/mg creat   Noroxymorphone                 919          EXPECTED   ng/mg creat    Sources of oxycodone are scheduled prescription medications.    Oxymorphone, noroxycodone, and noroxymorphone are expected    metabolites of oxycodone. Oxymorphone is also available as a    scheduled prescription medication. ==================================================================== Test                      Result    Flag   Units      Ref Range   Creatinine              97               mg/dL      >=20 ==================================================================== Declared Medications:  The flagging and interpretation on this report are based on the  following declared medications.  Unexpected results may arise from  inaccuracies in the declared medications.  **Note: The testing scope of this panel includes these medications:  Oxycodone (Percocet)  **Note: The testing scope of this panel does not include following  reported medications:  Acetaminophen (Percocet)  Amitriptyline (Elavil)  Fluticasone (Flonase)  Hydrochlorothiazide  Ibuprofen  Levothyroxine  Magnesium Oxide  Polyethylene Glycol  Vitamin D2 (Drisdol)  Vitamin D3 ==================================================================== For clinical consultation, please call [phone]. ====================================================================    UDS interpretation: Compliant          Medication Assessment Form: Reviewed. Patient indicates being compliant with therapy Treatment compliance: Compliant Risk  Assessment: Aberrant Behavior: None observed today Substance Use Disorder (SUD) Risk Level: Low-to-moderate Risk of opioid abuse or dependence: 0.7-3.0% with doses ? 36 MME/day and 6.1-26% with doses ? 120 MME/day. Opioid Risk Tool (ORT) Score: Total Score: 0 Low Risk for SUD (Score <3) Depression Scale Score: PHQ-2: PHQ-2 Total Score: 0 No depression (0) PHQ-9: PHQ-9 Total Score: 0 No depression (0-4)  Pharmacologic Plan: No change in therapy, at this time  Laboratory Chemistry  Inflammation Markers Lab Results  Component Value Date   ESRSEDRATE 3 12/06/2015   CRP <0.5 12/06/2015    Renal Function Lab Results  Component Value Date   BUN 13 12/06/2015   CREATININE 0.75 12/06/2015   GFRAA >60 12/06/2015   GFRNONAA >60 12/06/2015    Hepatic Function Lab Results  Component Value Date   AST 19 12/06/2015   ALT 17 12/06/2015   ALBUMIN 4.6 12/06/2015    Electrolytes Lab Results  Component Value Date   NA 138 12/06/2015   K 3.7 12/06/2015   CL 101 12/06/2015   CALCIUM 9.6 12/06/2015   MG 1.6 (L) 12/06/2015    Pain Modulating Vitamins Lab Results  Component Value Date   VD25OH 20.9 (L) 12/06/2015   VD125OH2TOT 28.6 12/06/2015   VITAMINB12 306 12/06/2015  Coagulation Parameters Lab Results  Component Value Date   PLT 192 05/17/2013    Cardiovascular Lab Results  Component Value Date   HGB 12.4 05/17/2013   HCT 35.7 05/17/2013    Note: Lab results reviewed.  Recent Diagnostic Imaging  No results found.  Meds  The patient has a current medication list which includes the following prescription(s): amitriptyline, vitamin d3, fluticasone, hydrochlorothiazide, ibuprofen, levothyroxine, magnesium oxide, oxycodone-acetaminophen, oxycodone-acetaminophen, oxycodone-acetaminophen, and polyethylene glycol powder.  Current Outpatient Prescriptions on File Prior to Visit  Medication Sig  . amitriptyline (ELAVIL) 25 MG tablet Take 1 tablet (25 mg total) by mouth  at bedtime.  . Cholecalciferol (VITAMIN D3) 2000 units capsule Take 1 capsule (2,000 Units total) by mouth daily.  . fluticasone (FLONASE) 50 MCG/ACT nasal spray instill 1 to 2 sprays into each nostril once daily  . hydrochlorothiazide (HYDRODIURIL) 25 MG tablet Take 1 tablet by mouth daily at 2 PM daily at 2 PM.  . ibuprofen (ADVIL,MOTRIN) 200 MG tablet Take 800 mg by mouth every 8 (eight) hours as needed.  Marland Kitchen levothyroxine (SYNTHROID, LEVOTHROID) 100 MCG tablet Take 100 mcg by mouth daily before breakfast.  . Magnesium Oxide 500 MG CAPS Take 1 capsule (500 mg total) by mouth daily.   No current facility-administered medications on file prior to visit.     ROS  Constitutional: Denies any fever or chills Gastrointestinal: No reported hemesis, hematochezia, vomiting, or acute GI distress Musculoskeletal: Denies any acute onset joint swelling, redness, loss of ROM, or weakness Neurological: No reported episodes of acute onset apraxia, aphasia, dysarthria, agnosia, amnesia, paralysis, loss of coordination, or loss of consciousness  Allergies  Ms. Bridge has No Known Allergies.  [place]  Medical:  Ms. Ziglar  has a past medical history of Arthritis and Asthma. Family: family history includes Diabetes in her father and mother; Heart disease in her father. Surgical:  has a past surgical history that includes Cesarean section; Cholecystectomy; and Tubal ligation. Tobacco:  reports that she has been smoking Cigarettes.  She has a 30.00 pack-year smoking history. She does not have any smokeless tobacco history on file. Alcohol:  reports that she drinks alcohol. Drug:  reports that she does not use drugs.  Constitutional Exam  Vitals: Blood pressure (!) 177/83, pulse 62, temperature 98.6 F (37 C), temperature source Oral, resp. rate 16, height 4\' 10"  (1.473 m), weight 150 lb (68 kg), SpO2 100 %. General appearance: Well nourished, well developed, and well hydrated. In no acute distress Calculated  BMI/Body habitus: Body mass index is 31.35 kg/m. (30-34.9 kg/m2) Obese (Class I) - 68% higher incidence of chronic pain Psych/Mental status: Alert and oriented x 3 (person, place, & time) Eyes: PERLA Respiratory: No evidence of acute respiratory distress  Cervical Spine Exam  Inspection: No masses, redness, or swelling Alignment: Symmetrical Functional ROM: ROM appears unrestricted Stability: No instability detected Muscle strength & Tone: Functionally intact Sensory: Unimpaired Palpation: Non-contributory  Upper Extremity (UE) Exam    Side: Right upper extremity  Side: Left upper extremity  Inspection: No masses, redness, swelling, or asymmetry  Inspection: No masses, redness, swelling, or asymmetry  Functional ROM: ROM appears unrestricted          Functional ROM: ROM appears unrestricted          Muscle strength & Tone: Functionally intact  Muscle strength & Tone: Functionally intact  Sensory: Unimpaired  Sensory: Unimpaired  Palpation: Non-contributory  Palpation: Non-contributory   Thoracic Spine Exam  Inspection: No masses, redness, or swelling  Alignment: Symmetrical Functional ROM: ROM appears unrestricted Stability: No instability detected Sensory: Unimpaired Muscle strength & Tone: Functionally intact Palpation: Non-contributory  Lumbar Spine Exam  Inspection: No masses, redness, or swelling Alignment: Symmetrical Functional ROM: Decreased ROM Stability: No instability detected Muscle strength & Tone: Functionally intact Sensory: Movement-associated pain Palpation: Complains of area being tender to palpation Provocative Tests: Lumbar Hyperextension and rotation test: Positive bilaterally for facet joint pain. Patrick's Maneuver: evaluation deferred today              Gait & Posture Assessment  Ambulation: Unassisted Gait: Relatively normal for age and body habitus Posture: WNL   Lower Extremity Exam    Side: Right lower extremity  Side: Left lower extremity   Inspection: No masses, redness, swelling, or asymmetry  Inspection: No masses, redness, swelling, or asymmetry  Functional ROM: ROM appears unrestricted          Functional ROM: ROM appears unrestricted          Muscle strength & Tone: Functionally intact  Muscle strength & Tone: Functionally intact  Sensory: Unimpaired  Sensory: Unimpaired  Palpation: Non-contributory  Palpation: Non-contributory    Assessment & Plan  Primary Diagnosis & Pertinent Problem List: The primary encounter diagnosis was Chronic low back pain (Location of Primary Source of Pain) (Bilateral) (L>R). Diagnoses of Chronic pain, Lumbar facet syndrome (Location of Primary Source of Pain) (Bilateral) (L>R), Chronic abdominal pain (Location of Secondary source of pain) (Epigastric), Generalized abdominal pain (Location of Secondary source of pain) (Epigastric), Chronic hip pain, unspecified laterality, Primary osteoarthritis of both hips, and Opioid-induced constipation (OIC) were also pertinent to this visit.  Visit Diagnosis: 1. Chronic low back pain (Location of Primary Source of Pain) (Bilateral) (L>R)   2. Chronic pain   3. Lumbar facet syndrome (Location of Primary Source of Pain) (Bilateral) (L>R)   4. Chronic abdominal pain (Location of Secondary source of pain) (Epigastric)   5. Generalized abdominal pain (Location of Secondary source of pain) (Epigastric)   6. Chronic hip pain, unspecified laterality   7. Primary osteoarthritis of both hips   8. Opioid-induced constipation (OIC)     Problems updated and reviewed during this visit: Problem  Opioid-induced constipation (OIC)    Problem-specific Plan(s): No problem-specific Assessment & Plan notes found for this encounter.  No new Assessment & Plan notes have been filed under this hospital service since the last note was generated. Service: Pain Management   Plan of Care   Problem List Items Addressed This Visit      High   Chronic abdominal pain  (Location of Secondary source of pain) (Epigastric) (Chronic)   Relevant Medications   oxyCODONE-acetaminophen (PERCOCET) 10-325 MG tablet   oxyCODONE-acetaminophen (PERCOCET) 10-325 MG tablet (Start on 07/04/2016)   oxyCODONE-acetaminophen (PERCOCET) 10-325 MG tablet (Start on 08/03/2016)   Chronic hip pain (Location of Tertiary source of pain) (Bilateral) (L>R) (Chronic)   Relevant Medications   oxyCODONE-acetaminophen (PERCOCET) 10-325 MG tablet   oxyCODONE-acetaminophen (PERCOCET) 10-325 MG tablet (Start on 07/04/2016)   oxyCODONE-acetaminophen (PERCOCET) 10-325 MG tablet (Start on 08/03/2016)   Chronic low back pain (Location of Primary Source of Pain) (Bilateral) (L>R) - Primary (Chronic)   Relevant Medications   oxyCODONE-acetaminophen (PERCOCET) 10-325 MG tablet   oxyCODONE-acetaminophen (PERCOCET) 10-325 MG tablet (Start on 07/04/2016)   oxyCODONE-acetaminophen (PERCOCET) 10-325 MG tablet (Start on 08/03/2016)   Chronic pain (Chronic)   Relevant Medications   oxyCODONE-acetaminophen (PERCOCET) 10-325 MG tablet   oxyCODONE-acetaminophen (PERCOCET) 10-325 MG tablet (Start on 07/04/2016)  oxyCODONE-acetaminophen (PERCOCET) 10-325 MG tablet (Start on 08/03/2016)   Generalized abdominal pain (Location of Secondary source of pain) (Epigastric) (Chronic)   Lumbar facet syndrome (Location of Primary Source of Pain) (Bilateral) (L>R) (Chronic)   Relevant Medications   oxyCODONE-acetaminophen (PERCOCET) 10-325 MG tablet   oxyCODONE-acetaminophen (PERCOCET) 10-325 MG tablet (Start on 07/04/2016)   oxyCODONE-acetaminophen (PERCOCET) 10-325 MG tablet (Start on 08/03/2016)   Osteoarthritis of hip (Location of Tertiary source of pain) (Bilateral) (L>R) (Chronic)   Relevant Medications   oxyCODONE-acetaminophen (PERCOCET) 10-325 MG tablet   oxyCODONE-acetaminophen (PERCOCET) 10-325 MG tablet (Start on 07/04/2016)   oxyCODONE-acetaminophen (PERCOCET) 10-325 MG tablet (Start on 08/03/2016)     Medium    Opioid-induced constipation (OIC) (Chronic)    Other Visit Diagnoses   None.      Pharmacotherapy (Medications Ordered): Meds ordered this encounter  Medications  . oxyCODONE-acetaminophen (PERCOCET) 10-325 MG tablet    Sig: Take 1 tablet by mouth every 8 (eight) hours as needed for pain.    Dispense:  90 tablet    Refill:  0    Do not place this medication, or any other prescription from our practice, on "Automatic Refill". Patient may have prescription filled one day early if pharmacy is closed on scheduled refill date. Do not fill until: 06/04/16 To last until: 07/04/16  . oxyCODONE-acetaminophen (PERCOCET) 10-325 MG tablet    Sig: Take 1 tablet by mouth every 8 (eight) hours as needed for pain.    Dispense:  90 tablet    Refill:  0    Do not place this medication, or any other prescription from our practice, on "Automatic Refill". Patient may have prescription filled one day early if pharmacy is closed on scheduled refill date. Do not fill until: 07/04/16 To last until: 08/03/16  . oxyCODONE-acetaminophen (PERCOCET) 10-325 MG tablet    Sig: Take 1 tablet by mouth every 8 (eight) hours as needed for pain.    Dispense:  90 tablet    Refill:  0    Do not place this medication, or any other prescription from our practice, on "Automatic Refill". Patient may have prescription filled one day early if pharmacy is closed on scheduled refill date. Do not fill until: 08/03/16 To last until: 09/02/16    [facility] & Procedure Ordered: No orders of the defined types were placed in this encounter.   Imaging Ordered: None  Interventional Therapies: Scheduled:  None at this time.    Considering:   1. Diagnostic, bilateral celiac plexus block, under fluoroscopic guidance and IV sedation.  2. Diagnostic bilateral intra-articular hip joint injection under fluoroscopic guidance, with or without sedation.  3. Diagnostic bilateral lumbar facet block under fluoroscopic guidance and IV  sedation.  4. Possible bilateral lumbar facet radiofrequency ablation. 5. Diagnostic left sided lumbar epidural steroid injection under fluoroscopic guidance, with or without IV sedation.  6. Diagnostic superior hypogastric nerve block under fluoroscopic guidance and IV sedation. 7. Diagnostic right-sided carpal tunnel injection without fluoroscopy or IV sedation.    PRN Procedures:  1. Diagnostic, bilateral celiac plexus block, under fluoroscopic guidance and IV sedation.  2. Diagnostic bilateral intra-articular hip joint injection under fluoroscopic guidance, with or without sedation.  3. Diagnostic bilateral lumbar facet block under fluoroscopic guidance and IV sedation.  4. Diagnostic left sided lumbar epidural steroid injection under fluoroscopic guidance, with or without IV sedation.  5. Diagnostic superior hypogastric nerve block under fluoroscopic guidance and IV sedation.  6. Diagnostic right-sided carpal tunnel injection without fluoroscopy or IV  sedation.    Referral(s) or Consult(s): None at this time.  New Prescriptions   No medications on file    Medications administered during this visit: Ms. Holle had no medications administered during this visit.  Requested PM Follow-up: Return in 3 months (on 08/20/2016) for Med-Mgmt.  Future Appointments Date Time Provider Robert Lee  08/20/2016 9:20 AM Milinda Pointer, MD [facility] None    Primary Care Physician: Christie Nottingham., PA Location: [facility] Outpatient Pain Management Facility Note by: Kathlen Brunswick Dossie Arbour, M.D, DABA, DABAPM, DABPM, DABIPP, FIPP  Pain Score Disclaimer: We use the NRS-11 scale. This is a self-reported, subjective measurement of pain severity with only modest accuracy. It is used primarily to identify changes within a particular patient. It must be understood that outpatient pain scales are significantly less accurate that those used for research, where they can be applied under ideal controlled  circumstances with minimal exposure to variables. In reality, the score is likely to be a combination of pain intensity and pain affect, where pain affect describes the degree of emotional arousal or changes in action readiness caused by the sensory experience of pain. Factors such as social and work situation, setting, emotional state, anxiety levels, expectation, and prior pain experience may influence pain perception and show large inter-individual differences that may also be affected by time variables.  Patient instructions provided during this appointment: There are no Patient Instructions on file for this visit.

## 2016-06-04 NOTE — Progress Notes (Signed)
Safety precautions to be maintained throughout the outpatient stay will include: orient to surroundings, keep bed in low position, maintain call bell within reach at all times, provide assistance with transfer out of bed and ambulation.  Oxycodone 10/325 mg 0 out of 90 remaining. Filled 05-04-16.

## 2016-06-06 ENCOUNTER — Telehealth: Payer: Self-pay

## 2016-06-06 ENCOUNTER — Other Ambulatory Visit: Payer: Self-pay

## 2016-06-06 MED ORDER — POLYETHYLENE GLYCOL 3350 17 GM/SCOOP PO POWD: ORAL | 0 refills | Status: DC

## 2016-06-06 NOTE — Telephone Encounter (Signed)
Miralax sent to pharmacy.  Patient notified.

## 2016-06-06 NOTE — Telephone Encounter (Signed)
Patient called and left vm. She said Dr. Dossie Arbour wants her to use Mirilax, otc. She wants a prescription for this because her insurance will pay for it.

## 2016-06-08 DIAGNOSIS — T402X5A Adverse effect of other opioids, initial encounter: Secondary | ICD-10-CM | POA: Insufficient documentation

## 2016-06-08 DIAGNOSIS — K5903 Drug induced constipation: Secondary | ICD-10-CM | POA: Insufficient documentation

## 2016-08-20 ENCOUNTER — Encounter: Payer: BLUE CROSS/BLUE SHIELD | Admitting: Pain Medicine

## 2016-08-28 ENCOUNTER — Encounter: Payer: Self-pay | Admitting: Pain Medicine

## 2016-08-28 ENCOUNTER — Ambulatory Visit: Payer: BLUE CROSS/BLUE SHIELD | Attending: Pain Medicine | Admitting: Pain Medicine

## 2016-08-28 VITALS — BP 164/86 | HR 78 | Temp 96.5°F | Resp 16 | Ht <= 58 in | Wt 140.0 lb

## 2016-08-28 DIAGNOSIS — K5900 Constipation, unspecified: Secondary | ICD-10-CM | POA: Insufficient documentation

## 2016-08-28 DIAGNOSIS — E041 Nontoxic single thyroid nodule: Secondary | ICD-10-CM | POA: Insufficient documentation

## 2016-08-28 DIAGNOSIS — G5601 Carpal tunnel syndrome, right upper limb: Secondary | ICD-10-CM | POA: Insufficient documentation

## 2016-08-28 DIAGNOSIS — F329 Major depressive disorder, single episode, unspecified: Secondary | ICD-10-CM | POA: Insufficient documentation

## 2016-08-28 DIAGNOSIS — F17210 Nicotine dependence, cigarettes, uncomplicated: Secondary | ICD-10-CM | POA: Diagnosis not present

## 2016-08-28 DIAGNOSIS — Z5181 Encounter for therapeutic drug level monitoring: Secondary | ICD-10-CM | POA: Diagnosis present

## 2016-08-28 DIAGNOSIS — R102 Pelvic and perineal pain: Secondary | ICD-10-CM | POA: Insufficient documentation

## 2016-08-28 DIAGNOSIS — M79605 Pain in left leg: Secondary | ICD-10-CM | POA: Diagnosis not present

## 2016-08-28 DIAGNOSIS — M545 Low back pain: Secondary | ICD-10-CM | POA: Diagnosis not present

## 2016-08-28 DIAGNOSIS — M1610 Unilateral primary osteoarthritis, unspecified hip: Secondary | ICD-10-CM | POA: Diagnosis not present

## 2016-08-28 DIAGNOSIS — E1140 Type 2 diabetes mellitus with diabetic neuropathy, unspecified: Secondary | ICD-10-CM | POA: Diagnosis not present

## 2016-08-28 DIAGNOSIS — I10 Essential (primary) hypertension: Secondary | ICD-10-CM | POA: Diagnosis not present

## 2016-08-28 DIAGNOSIS — M25561 Pain in right knee: Secondary | ICD-10-CM | POA: Insufficient documentation

## 2016-08-28 DIAGNOSIS — F1190 Opioid use, unspecified, uncomplicated: Secondary | ICD-10-CM | POA: Diagnosis not present

## 2016-08-28 DIAGNOSIS — G894 Chronic pain syndrome: Secondary | ICD-10-CM | POA: Insufficient documentation

## 2016-08-28 DIAGNOSIS — M797 Fibromyalgia: Secondary | ICD-10-CM | POA: Diagnosis not present

## 2016-08-28 DIAGNOSIS — Z6829 Body mass index (BMI) 29.0-29.9, adult: Secondary | ICD-10-CM | POA: Diagnosis not present

## 2016-08-28 DIAGNOSIS — Z79891 Long term (current) use of opiate analgesic: Secondary | ICD-10-CM | POA: Diagnosis not present

## 2016-08-28 DIAGNOSIS — Z79899 Other long term (current) drug therapy: Secondary | ICD-10-CM | POA: Diagnosis not present

## 2016-08-28 DIAGNOSIS — E039 Hypothyroidism, unspecified: Secondary | ICD-10-CM | POA: Diagnosis not present

## 2016-08-28 DIAGNOSIS — G8929 Other chronic pain: Secondary | ICD-10-CM

## 2016-08-28 DIAGNOSIS — E663 Overweight: Secondary | ICD-10-CM | POA: Insufficient documentation

## 2016-08-28 DIAGNOSIS — J449 Chronic obstructive pulmonary disease, unspecified: Secondary | ICD-10-CM | POA: Insufficient documentation

## 2016-08-28 DIAGNOSIS — M25559 Pain in unspecified hip: Secondary | ICD-10-CM

## 2016-08-28 DIAGNOSIS — R109 Unspecified abdominal pain: Secondary | ICD-10-CM

## 2016-08-28 MED ORDER — OXYCODONE-ACETAMINOPHEN 10-325 MG PO TABS: 1.0000 | ORAL_TABLET | Freq: Three times a day (TID) | ORAL | 0 refills | Status: DC | PRN

## 2016-08-28 NOTE — Progress Notes (Signed)
Patient's Name: Vicki Fox  MRN: [id number]  Referring Provider: Christie Nottingham, [place]  DOB: Nov 02, 1965  PCP: Christie Nottingham, PA  DOS: 08/28/2016  Note by: Kathlen Brunswick. Dossie Arbour, MD  Service setting: Ambulatory outpatient  Specialty: Interventional Pain Management  Location: [facility] (AMB) Pain Management Facility    Patient type: Established   Primary Reason(s) for Visit: Encounter for prescription drug management (Level of risk: moderate) CC: Back Pain (low back) and Knee Pain (right side)  HPI  Vicki Fox is a 50 y.o. year old, female patient, who comes today for a medication management evaluation. She has Fibromyalgia; Essential hypertension; Hypothyroidism; Vitamin D deficiency; Chronic abdominal pain (Location of Secondary source of pain) (Epigastric); CN (constipation); Overweight (BMI 25.0-29.9); FH: diabetes mellitus; Chronic pain; Long term current use of opiate analgesic; Long term prescription opiate use; Opiate use (45 MME/Day); Encounter for therapeutic drug level monitoring; Encounter for chronic pain management; Depression; Gallbladder disease; Adhesions Pelvic; Generalized abdominal pain (Location of Secondary source of pain) (Epigastric); Adhesions Abdominal; Chronic pelvic pain in female; Chronic low back pain (Location of Primary Source of Pain) (Bilateral) (L>R); Lumbar spondylosis; Chronic lower extremity pain (Bilateral) (L>R); Chronic lumbar radicular pain; Osteoarthritis of hip (Location of Tertiary source of pain) (Bilateral) (L>R); Chronic hip pain (Location of Tertiary source of pain) (Bilateral) (L>R); CRP elevated; Thyroid nodule; COPD with emphysema ([place]); Nicotine dependence; Hypomagnesemia; Tobacco abuse; Neuropathy ([place]); Neurogenic pain; Neuropathic pain; Right carpal tunnel syndrome; Lumbar facet syndrome (Location of Primary Source of Pain) (Bilateral) (L>R); Opioid-induced constipation ([facility]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 Her primarily concern today is  the Back Pain (low back) and Knee Pain (right side)  Pain Assessment: Self-Reported Pain Score: 7 /10 Clinically the patient looks like a 2/10 Reported level is inconsistent with clinical observations. Information on the proper use of the pain score provided to the patient today. Pain Type: Chronic pain Pain Location: Back (knee) Pain Orientation: Lower (right) Pain Descriptors / Indicators: Sharp, Stabbing Pain Frequency: Intermittent  Vicki Fox was last seen on 06/04/2016 for medication management. During today's appointment we reviewed Vicki Fox's chronic pain status, as well as her outpatient medication regimen.  The patient  reports that she does not use drugs. Her body mass index is 29.26 kg/m.  Further details on both, my assessment(s), as well as the proposed treatment plan, please see below.  Controlled Substance Pharmacotherapy Assessment REMS (Risk Evaluation and Mitigation Strategy)  Analgesic:Oxycodone/APAP 10/325 one tablet every 8 hours when necessary (30 mg/day) MME/day:45 mg/day [name],[name], RN  08/28/2016  9:05 AM  Sign at close encounter Nursing Pain Medication Assessment:  Safety precautions to be maintained throughout the outpatient stay will include: orient to surroundings, keep bed in low position, maintain call bell within reach at all times, provide assistance with transfer out of bed and ambulation.  Medication Inspection Compliance: Pill count conducted under aseptic conditions, in front of the patient. Neither the pills nor the bottle was removed from the patient's sight at any time. Once count was completed pills were immediately returned to the patient in their original bottle.  Medication: See above Pill Count: 0 of 90 pills remain Bottle Appearance: Standard pharmacy container. Clearly labeled. Filled Date: 47 / 5 / 2017 Medication last intake:08/28/16   Pharmacokinetics: Liberation and absorption (onset of action): WNL Distribution (time  to peak effect): WNL Metabolism and excretion (duration of action): WNL         Pharmacodynamics: Desired effects: Analgesia: Vicki Fox reports >  50% benefit. Functional ability: Patient reports that medication allows her to accomplish basic ADLs Clinically meaningful improvement in function (CMIF): Sustained CMIF goals met Perceived effectiveness: Described as relatively effective, allowing for increase in activities of daily living (ADL) Undesirable effects: Side-effects or Adverse reactions: None reported Monitoring: [place] PMP: Online review of the past 67-monthperiod conducted. Compliant with practice rules and regulations List of all UDS test(s) done:  Lab Results  Component Value Date   TOXASSSELUR FINAL 03/05/2016   TOXASSSELUR FINAL 12/06/2015   TOXASSSELUR FINAL 09/05/2015   Last UDS on record: ToxAssure Select 13  Date Value Ref Range Status  03/05/2016 FINAL  Final    Comment:    ==================================================================== TOXASSURE SELECT 13 (MW) ==================================================================== Test                             Result       Flag       Units Drug Present and Declared for Prescription Verification   Oxycodone                      2828         EXPECTED   ng/mg creat   Oxymorphone                    5114         EXPECTED   ng/mg creat   Noroxycodone                   7706         EXPECTED   ng/mg creat   Noroxymorphone                 919          EXPECTED   ng/mg creat    Sources of oxycodone are scheduled prescription medications.    Oxymorphone, noroxycodone, and noroxymorphone are expected    metabolites of oxycodone. Oxymorphone is also available as a    scheduled prescription medication. ==================================================================== Test                      Result    Flag   Units      Ref Range   Creatinine              97               mg/dL       >=20 ==================================================================== Declared Medications:  The flagging and interpretation on this report are based on the  following declared medications.  Unexpected results may arise from  inaccuracies in the declared medications.  **Note: The testing scope of this panel includes these medications:  Oxycodone (Percocet)  **Note: The testing scope of this panel does not include following  reported medications:  Acetaminophen (Percocet)  Amitriptyline (Elavil)  Fluticasone (Flonase)  Hydrochlorothiazide  Ibuprofen  Levothyroxine  Magnesium Oxide  Polyethylene Glycol  Vitamin D2 (Drisdol)  Vitamin D3 ==================================================================== For clinical consultation, please call [phone] ====================================================================    UDS interpretation: Compliant          Medication Assessment Form: Reviewed. Patient indicates being compliant with therapy Treatment compliance: Compliant Risk Assessment Profile: Aberrant behavior: See prior evaluations. None observed or detected today Comorbid factors increasing risk of overdose: See prior notes. No additional risks detected today Risk of substance use disorder (SUD): Low Opioid Risk Tool (ORT) Total Score: 0  Interpretation Table:  Score <3 = Low Risk for SUD  Score between 4-7 = Moderate Risk for SUD  Score >8 = High Risk for Opioid Abuse   Risk Mitigation Strategies:  Patient Counseling: Covered Patient-Prescriber Agreement (PPA): Present and active  Notification to other healthcare providers: Done  Pharmacologic Plan: No change in therapy, at this time  Laboratory Chemistry  Inflammation Markers Lab Results  Component Value Date   ESRSEDRATE 3 12/06/2015   CRP <0.5 12/06/2015   Renal Function Lab Results  Component Value Date   BUN 13 12/06/2015   CREATININE 0.75 12/06/2015   GFRAA >60 12/06/2015   GFRNONAA  >60 12/06/2015   Hepatic Function Lab Results  Component Value Date   AST 19 12/06/2015   ALT 17 12/06/2015   ALBUMIN 4.6 12/06/2015   Electrolytes Lab Results  Component Value Date   NA 138 12/06/2015   K 3.7 12/06/2015   CL 101 12/06/2015   CALCIUM 9.6 12/06/2015   MG 1.6 (L) 12/06/2015   Pain Modulating Vitamins Lab Results  Component Value Date   VD25OH 20.9 (L) 12/06/2015   VD125OH2TOT 28.6 12/06/2015   VITAMINB12 306 12/06/2015   Coagulation Parameters Lab Results  Component Value Date   PLT 192 05/17/2013   Cardiovascular Lab Results  Component Value Date   HGB 12.4 05/17/2013   HCT 35.7 05/17/2013   Note: Lab results reviewed.  Recent Diagnostic Imaging Review  No results found. Note: Imaging results reviewed.  Meds  The patient has a current medication list which includes the following prescription(s): amitriptyline, vitamin d3, fluticasone, hydrochlorothiazide, ibuprofen, levothyroxine, magnesium oxide, oxycodone-acetaminophen, oxycodone-acetaminophen, oxycodone-acetaminophen, and polyethylene glycol powder.  Current Outpatient Prescriptions on File Prior to Visit  Medication Sig  . amitriptyline (ELAVIL) 25 MG tablet Take 1 tablet (25 mg total) by mouth at bedtime.  . Cholecalciferol (VITAMIN D3) 2000 units capsule Take 1 capsule (2,000 Units total) by mouth daily.  . fluticasone (FLONASE) 50 MCG/ACT nasal spray instill 1 to 2 sprays into each nostril once daily  . hydrochlorothiazide (HYDRODIURIL) 25 MG tablet Take 1 tablet by mouth daily at 2 PM daily at 2 PM.  . ibuprofen (ADVIL,MOTRIN) 200 MG tablet Take 800 mg by mouth every 8 (eight) hours as needed.  Marland Kitchen levothyroxine (SYNTHROID, LEVOTHROID) 100 MCG tablet Take 100 mcg by mouth daily before breakfast.  . Magnesium Oxide 500 MG CAPS Take 1 capsule (500 mg total) by mouth daily.  . polyethylene glycol powder (GLYCOLAX/MIRALAX) powder take 17GM (DISSOLVED IN WATER) by mouth once daily   No current  facility-administered medications on file prior to visit.    ROS  Constitutional: Denies any fever or chills Gastrointestinal: No reported hemesis, hematochezia, vomiting, or acute GI distress Musculoskeletal: Denies any acute onset joint swelling, redness, loss of ROM, or weakness Neurological: No reported episodes of acute onset apraxia, aphasia, dysarthria, agnosia, amnesia, paralysis, loss of coordination, or loss of consciousness  Allergies  Ms. Martello has No Known Allergies.  [place]  Drug: Ms. Nau  reports that she does not use drugs. Alcohol:  reports that she drinks alcohol. Tobacco:  reports that she has been smoking Cigarettes.  She has a 30.00 pack-year smoking history. She has never used smokeless tobacco. Medical:  has a past medical history of Arthritis and Asthma. Family: family history includes Diabetes in her father and mother; Heart disease in her father.  Past Surgical History:  Procedure Laterality Date  . CESAREAN SECTION    . CHOLECYSTECTOMY    .  TUBAL LIGATION     Constitutional Exam  General appearance: Well nourished, well developed, and well hydrated. In no apparent acute distress Vitals:   08/28/16 0854  BP: (!) 164/86  Pulse: 78  Resp: 16  Temp: (!) 96.5 F (35.8 C)  TempSrc: Oral  SpO2: 100%  Weight: 140 lb (63.5 kg)  Height: 4' 10"  (1.473 m)   BMI Assessment: Estimated body mass index is 29.26 kg/m as calculated from the following:   Height as of this encounter: 4' 10"  (1.473 m).   Weight as of this encounter: 140 lb (63.5 kg).  BMI interpretation table: BMI level Category Range association with higher incidence of chronic pain  <18 kg/m2 Underweight   18.5-24.9 kg/m2 Ideal body weight   25-29.9 kg/m2 Overweight Increased incidence by 20%  30-34.9 kg/m2 Obese (Class I) Increased incidence by 68%  35-39.9 kg/m2 Severe obesity (Class II) Increased incidence by 136%  >40 kg/m2 Extreme obesity (Class III) Increased incidence by 254%   BMI  Readings from Last 4 Encounters:  08/28/16 29.26 kg/m  06/04/16 31.35 kg/m  03/05/16 30.31 kg/m  12/06/15 28.76 kg/m   Wt Readings from Last 4 Encounters:  08/28/16 140 lb (63.5 kg)  06/04/16 150 lb (68 kg)  03/05/16 145 lb (65.8 kg)  12/06/15 140 lb (63.5 kg)  Psych/Mental status: Alert, oriented x 3 (person, place, & time) Eyes: PERLA Respiratory: No evidence of acute respiratory distress  Cervical Spine Exam  Inspection: No masses, redness, or swelling Alignment: Symmetrical Functional ROM: Unrestricted ROM Stability: No instability detected Muscle strength & Tone: Functionally intact Sensory: Unimpaired Palpation: Non-contributory  Upper Extremity (UE) Exam    Side: Right upper extremity  Side: Left upper extremity  Inspection: No masses, redness, swelling, or asymmetry  Inspection: No masses, redness, swelling, or asymmetry  Functional ROM: Unrestricted ROM          Functional ROM: Unrestricted ROM          Muscle strength & Tone: Functionally intact  Muscle strength & Tone: Functionally intact  Sensory: Unimpaired  Sensory: Unimpaired  Palpation: Non-contributory  Palpation: Non-contributory   Thoracic Spine Exam  Inspection: No masses, redness, or swelling Alignment: Symmetrical Functional ROM: Unrestricted ROM Stability: No instability detected Sensory: Unimpaired Muscle strength & Tone: Functionally intact Palpation: Non-contributory  Lumbar Spine Exam  Inspection: No masses, redness, or swelling Alignment: Symmetrical Functional ROM: Unrestricted ROM Stability: No instability detected Muscle strength & Tone: Functionally intact Sensory: Unimpaired Palpation: Non-contributory Provocative Tests: Lumbar Hyperextension and rotation test: evaluation deferred today       Patrick's Maneuver: evaluation deferred today              Gait & Posture Assessment  Ambulation: Unassisted Gait: Relatively normal for age and body habitus Posture: WNL   Lower  Extremity Exam    Side: Right lower extremity  Side: Left lower extremity  Inspection: No masses, redness, swelling, or asymmetry  Inspection: No masses, redness, swelling, or asymmetry  Functional ROM: Unrestricted ROM          Functional ROM: Unrestricted ROM          Muscle strength & Tone: Functionally intact  Muscle strength & Tone: Functionally intact  Sensory: Unimpaired  Sensory: Unimpaired  Palpation: Non-contributory  Palpation: Non-contributory   Assessment  Primary Diagnosis & Pertinent Problem List: The primary encounter diagnosis was Mechanical knee pain, right. Diagnoses of Chronic pain syndrome, Long term current use of opiate analgesic, Opiate use (45 MME/Day), Chronic low back pain (  Location of Primary Source of Pain) (Bilateral) (L>R), Chronic abdominal pain (Location of Secondary source of pain) (Epigastric), and Chronic hip pain, unspecified laterality were also pertinent to this visit.  Visit Diagnosis: 1. Mechanical knee pain, right   2. Chronic pain syndrome   3. Long term current use of opiate analgesic   4. Opiate use (45 MME/Day)   5. Chronic low back pain (Location of Primary Source of Pain) (Bilateral) (L>R)   6. Chronic abdominal pain (Location of Secondary source of pain) (Epigastric)   7. Chronic hip pain, unspecified laterality    Plan of Care  Pharmacotherapy (Medications Ordered): Meds ordered this encounter  Medications  . oxyCODONE-acetaminophen (PERCOCET) 10-325 MG tablet    Sig: Take 1 tablet by mouth every 8 (eight) hours as needed for pain.    Dispense:  90 tablet    Refill:  0    Do not place this medication, or any other prescription from our practice, on "Automatic Refill". Patient may have prescription filled one day early if pharmacy is closed on scheduled refill date. Do not fill until: 10/02/16 To last until: 11/01/16  . oxyCODONE-acetaminophen (PERCOCET) 10-325 MG tablet    Sig: Take 1 tablet by mouth every 8 (eight) hours as needed  for pain.    Dispense:  90 tablet    Refill:  0    Do not place this medication, or any other prescription from our practice, on "Automatic Refill". Patient may have prescription filled one day early if pharmacy is closed on scheduled refill date. Do not fill until: 09/02/16 To last until: 10/02/16  . oxyCODONE-acetaminophen (PERCOCET) 10-325 MG tablet    Sig: Take 1 tablet by mouth every 8 (eight) hours as needed for pain.    Dispense:  90 tablet    Refill:  0    Do not place this medication, or any other prescription from our practice, on "Automatic Refill". Patient may have prescription filled one day early if pharmacy is closed on scheduled refill date. Do not fill until: 11/01/16 To last until: 12/01/16   New Prescriptions   No medications on file   Medications administered today: Ms. Holzmann had no medications administered during this visit. Lab-work, procedure(s), and/or referral(s): Orders Placed This Encounter  Procedures  . KNEE INJECTION  . DG Knee 1-2 Views Right   Imaging and/or referral(s): DG KNEE 1-2 VIEWS RIGHT  Interventional therapies: Planned, scheduled, and/or pending:   Diagnostic Right intra-articular knee injection   Considering:   Diagnostic, bilateral celiac plexus block, under fluoroscopic guidance and IV sedation.  Diagnostic bilateral intra-articular hip joint injection under fluoroscopic guidance, with or without sedation.  Diagnostic bilateral lumbar facet block under fluoroscopic guidance and IV sedation.  Possible bilateral lumbar facet radiofrequency ablation. Diagnostic left sided lumbar epidural steroid injection under fluoroscopic guidance, with or without IV sedation.  Diagnostic superior hypogastric nerve block under fluoroscopic guidance and IV sedation. Diagnostic right-sided carpal tunnel injection without fluoroscopy or IV sedation.    Palliative PRN treatment(s):   Diagnostic, bilateral celiac plexus block, under fluoroscopic  guidance and IV sedation.  Diagnostic bilateral intra-articular hip joint injection under fluoroscopic guidance, with or without sedation.  Diagnostic bilateral lumbar facet block under fluoroscopic guidance and IV sedation.  Diagnostic left sided lumbar epidural steroid injection under fluoroscopic guidance, with or without IV sedation.  Diagnostic superior hypogastric nerve block under fluoroscopic guidance and IV sedation.  Diagnostic right-sided carpal tunnel injection without fluoroscopy or IV sedation.    Provider-requested follow-up: Return  in about 3 months (around 11/26/2016) for in addition, procedure (ASAP).  Future Appointments Date Time Provider [place]  09/03/2016 8:15 AM Milinda Pointer, MD [facility] None  11/25/2016 8:30 AM Milinda Pointer, MD [facility] None   Primary Care Physician: Christie Nottingham, PA Location: [facility] Outpatient Pain Management Facility Note by: Kathlen Brunswick. Dossie Arbour, M.D, DABA, DABAPM, DABPM, DABIPP, FIPP  Pain Score Disclaimer: We use the NRS-11 scale. This is a self-reported, subjective measurement of pain severity with only modest accuracy. It is used primarily to identify changes within a particular patient. It must be understood that outpatient pain scales are significantly less accurate that those used for research, where they can be applied under ideal controlled circumstances with minimal exposure to variables. In reality, the score is likely to be a combination of pain intensity and pain affect, where pain affect describes the degree of emotional arousal or changes in action readiness caused by the sensory experience of pain. Factors such as social and work situation, setting, emotional state, anxiety levels, expectation, and prior pain experience may influence pain perception and show large inter-individual differences that may also be affected by time variables.  Patient instructions provided during this appointment: Patient Instructions    Knee Injection A knee injection is a procedure to get medicine into your knee joint. Your health care provider puts a needle into the joint and injects medicine with an attached syringe. The injected medicine may relieve the pain, swelling, and stiffness of arthritis. The injected medicine may also help to lubricate and cushion your knee joint. You may need more than one injection. Tell a health care provider about:  Any allergies you have.  All medicines you are taking, including vitamins, herbs, eye drops, creams, and over-the-counter medicines.  Any problems you or family members have had with anesthetic medicines.  Any blood disorders you have.  Any surgeries you have had.  Any medical conditions you have. What are the risks? Generally, this is a safe procedure. However, problems may occur, including:  Infection.  Bleeding.  Worsening symptoms.  Damage to the area around your knee.  Allergic reaction to any of the medicines.  Skin reactions from repeated injections. What happens before the procedure?  Ask your health care provider about changing or stopping your regular medicines. This is especially important if you are taking diabetes medicines or blood thinners.  Plan to have someone take you home after the procedure. What happens during the procedure?  You will sit or lie down in a position for your knee to be treated.  The skin over your kneecap will be cleaned with a germ-killing solution (antiseptic).  You will be given a medicine that numbs the area (local anesthetic). You may feel some stinging.  After your knee becomes numb, you will have a second injection. This is the medicine. This needle is carefully placed between your kneecap and your knee. The medicine is injected into the joint space.  At the end of the procedure, the needle will be removed.  A bandage (dressing) may be placed over the injection site. The procedure may vary among health care  providers and hospitals. What happens after the procedure?  You may have to move your knee through its full range of motion. This helps to get all of the medicine into your joint space.  Your blood pressure, heart rate, breathing rate, and blood oxygen level will be monitored often until the medicines you were given have worn off.  You will be watched to  make sure that you do not have a reaction to the injected medicine. This information is not intended to replace advice given to you by your health care provider. Make sure you discuss any questions you have with your health care provider. Document Released: 12/07/2006 Document Revised: 02/15/2016 Document Reviewed: 07/26/2014 Elsevier Interactive Patient Education  2017 [employer].

## 2016-08-28 NOTE — Progress Notes (Signed)
Nursing Pain Medication Assessment:  Safety precautions to be maintained throughout the outpatient stay will include: orient to surroundings, keep bed in low position, maintain call bell within reach at all times, provide assistance with transfer out of bed and ambulation.  Medication Inspection Compliance: Pill count conducted under aseptic conditions, in front of the patient. Neither the pills nor the bottle was removed from the patient's sight at any time. Once count was completed pills were immediately returned to the patient in their original bottle.  Medication: See above Pill Count: 0 of 90 pills remain Bottle Appearance: Standard pharmacy container. Clearly labeled. Filled Date: 25 / 5 / 2017 Medication last intake:08/28/16

## 2016-08-28 NOTE — Patient Instructions (Signed)
Knee Injection A knee injection is a procedure to get medicine into your knee joint. Your health care provider puts a needle into the joint and injects medicine with an attached syringe. The injected medicine may relieve the pain, swelling, and stiffness of arthritis. The injected medicine may also help to lubricate and cushion your knee joint. You may need more than one injection. Tell a health care provider about:  Any allergies you have.  All medicines you are taking, including vitamins, herbs, eye drops, creams, and over-the-counter medicines.  Any problems you or family members have had with anesthetic medicines.  Any blood disorders you have.  Any surgeries you have had.  Any medical conditions you have. What are the risks? Generally, this is a safe procedure. However, problems may occur, including:  Infection.  Bleeding.  Worsening symptoms.  Damage to the area around your knee.  Allergic reaction to any of the medicines.  Skin reactions from repeated injections. What happens before the procedure?  Ask your health care provider about changing or stopping your regular medicines. This is especially important if you are taking diabetes medicines or blood thinners.  Plan to have someone take you home after the procedure. What happens during the procedure?  You will sit or lie down in a position for your knee to be treated.  The skin over your kneecap will be cleaned with a germ-killing solution (antiseptic).  You will be given a medicine that numbs the area (local anesthetic). You may feel some stinging.  After your knee becomes numb, you will have a second injection. This is the medicine. This needle is carefully placed between your kneecap and your knee. The medicine is injected into the joint space.  At the end of the procedure, the needle will be removed.  A bandage (dressing) may be placed over the injection site. The procedure may vary among health care  providers and hospitals. What happens after the procedure?  You may have to move your knee through its full range of motion. This helps to get all of the medicine into your joint space.  Your blood pressure, heart rate, breathing rate, and blood oxygen level will be monitored often until the medicines you were given have worn off.  You will be watched to make sure that you do not have a reaction to the injected medicine. This information is not intended to replace advice given to you by your health care provider. Make sure you discuss any questions you have with your health care provider. Document Released: 12/07/2006 Document Revised: 02/15/2016 Document Reviewed: 07/26/2014 [employer] Interactive Patient Education  2017 [employer]  

## 2016-09-03 ENCOUNTER — Ambulatory Visit: Payer: BLUE CROSS/BLUE SHIELD | Admitting: Pain Medicine

## 2016-10-07 ENCOUNTER — Telehealth: Payer: Self-pay | Admitting: Pain Medicine

## 2016-10-07 NOTE — Telephone Encounter (Signed)
Patient wants to know if she can get miralax script called in

## 2016-10-07 NOTE — Telephone Encounter (Signed)
Attempted to reach patient, the number that we have is a mobile number and is no longer in service.

## 2016-10-07 NOTE — Telephone Encounter (Signed)
Unable to contact patient, the number given has been disconnected.

## 2016-10-08 ENCOUNTER — Telehealth: Payer: Self-pay | Admitting: Pain Medicine

## 2016-10-08 NOTE — Telephone Encounter (Signed)
Patient called back with new phone [phone]. She wants to know if we will call in script for Miralax

## 2016-10-08 NOTE — Telephone Encounter (Signed)
Called number that patient left, which connected me to her husband.  He stated that he would let her know we called when he gets home.

## 2016-11-24 DIAGNOSIS — M1711 Unilateral primary osteoarthritis, right knee: Secondary | ICD-10-CM | POA: Insufficient documentation

## 2016-11-24 DIAGNOSIS — G8929 Other chronic pain: Secondary | ICD-10-CM | POA: Insufficient documentation

## 2016-11-24 DIAGNOSIS — M25561 Pain in right knee: Secondary | ICD-10-CM

## 2016-11-24 DIAGNOSIS — G894 Chronic pain syndrome: Secondary | ICD-10-CM | POA: Insufficient documentation

## 2016-11-24 NOTE — Progress Notes (Signed)
Patient's Name: Vicki Fox  MRN: [id number]  Referring Provider: Christie Nottingham, [place]  DOB: 08/31/1966  PCP: Christie Nottingham, PA  DOS: 11/25/2016  Note by: Kathlen Brunswick. Dossie Arbour, MD  Service setting: Ambulatory outpatient  Specialty: Interventional Pain Management  Location: [facility] (AMB) Pain Management Facility    Patient type: Established   Primary Reason(s) for Visit: Encounter for prescription drug management (Level of risk: moderate) CC: Hip Pain (bilateral); Back Pain (lower); and Abdominal Pain  HPI  Vicki Fox is a 51 y.o. year old, female patient, who comes today for a medication management evaluation. She has Fibromyalgia; Essential hypertension; Hypothyroidism; Vitamin D deficiency; Chronic abdominal pain (Location of Secondary source of pain) (Epigastric); CN (constipation); Overweight (BMI 25.0-29.9); FH: diabetes mellitus; Long term current use of opiate analgesic; Long term prescription opiate use; Opiate use (45 MME/Day); Encounter for therapeutic drug level monitoring; Encounter for chronic pain management; Depression; Gallbladder disease; Adhesions Pelvic; Generalized abdominal pain (Location of Secondary source of pain) (Epigastric); Adhesions Abdominal; Chronic pelvic pain in female; Chronic low back pain (Location of Primary Source of Pain) (Bilateral) (L>R); Lumbar spondylosis; Chronic lower extremity pain (Bilateral) (L>R); Chronic radicular pain of lower extremity(2) (B) (S1) (L>R); Osteoarthritis of hip (Location of Tertiary source of pain) (Bilateral) (L>R); Chronic hip pain (Location of Tertiary source of pain) (Bilateral) (L>R); CRP elevated; Thyroid nodule; COPD with emphysema ([place]); Nicotine dependence; Hypomagnesemia; Tobacco abuse; Neuropathy ([place]); Neurogenic pain; Neuropathic pain; Right carpal tunnel syndrome; Lumbar facet syndrome (Location of Primary Source of Pain) (Bilateral) (L>R); Opioid-induced constipation ([place]); Mechanical knee pain, right; Chronic pain syndrome;  Chronic knee pain (Right); and Osteoarthritis of knee (Right) on her problem list. Her primarily concern today is the Hip Pain (bilateral); Back Pain (lower); and Abdominal Pain  Pain Assessment: Self-Reported Pain Score: 4 /10 Clinically the patient looks like a 2/10 Reported level is inconsistent with clinical observations. Information on the proper use of the pain scale provided to the patient today Pain Type: Chronic pain Pain Location: Hip Pain Orientation: Right, Left Pain Descriptors / Indicators: Throbbing, Aching Pain Frequency: Intermittent  Vicki Fox was last scheduled for an appointment on 10/08/2016 for medication management. During today's appointment we reviewed Vicki Fox's chronic pain status, as well as her outpatient medication regimen.  The patient  reports that she does not use drugs. Her body mass index is 30.31 kg/m.  Further details on both, my assessment(s), as well as the proposed treatment plan, please see below.  Controlled Substance Pharmacotherapy Assessment REMS (Risk Evaluation and Mitigation Strategy)  Analgesic:Oxycodone/APAP 10/325 one tablet every 8 hours when necessary (30 mg/day) MME/day:45 mg/day Landis Martins, RN  11/25/2016  8:33 AM  Sign at close encounter Nursing Pain Medication Assessment:  Safety precautions to be maintained throughout the outpatient stay will include: orient to surroundings, keep bed in low position, maintain call bell within reach at all times, provide assistance with transfer out of bed and ambulation.  Medication Inspection Compliance: Pill count conducted under aseptic conditions, in front of the patient. Neither the pills nor the bottle was removed from the patient's sight at any time. Once count was completed pills were immediately returned to the patient in their original bottle.  Medication: Oxycodone/APAP Pill/Patch Count: 0 of 90 pills remain Bottle Appearance: Standard pharmacy container. Clearly  labeled. Filled Date: 02 / 03 / 2018 Last Medication intake:  Today   Pharmacokinetics: Liberation and absorption (onset of action): WNL Distribution (time to peak effect): WNL Metabolism and excretion (duration  of action): WNL         Pharmacodynamics: Desired effects: Analgesia: Vicki Fox reports >50% benefit. Functional ability: Patient reports that medication allows her to accomplish basic ADLs Clinically meaningful improvement in function (CMIF): Sustained CMIF goals met Perceived effectiveness: Described as relatively effective, allowing for increase in activities of daily living (ADL) Undesirable effects: Side-effects or Adverse reactions: None reported Monitoring: [place] PMP: Online review of the past 28-monthperiod conducted. Compliant with practice rules and regulations List of all UDS test(s) done:  Lab Results  Component Value Date   TOXASSSELUR FINAL 03/05/2016   TOXASSSELUR FINAL 12/06/2015   TOXASSSELUR FINAL 09/05/2015   Last UDS on record: ToxAssure Select 13  Date Value Ref Range Status  03/05/2016 FINAL  Final    Comment:    ==================================================================== TOXASSURE SELECT 13 (MW) ==================================================================== Test                             Result       Flag       Units Drug Present and Declared for Prescription Verification   Oxycodone                      2828         EXPECTED   ng/mg creat   Oxymorphone                    5114         EXPECTED   ng/mg creat   Noroxycodone                   7706         EXPECTED   ng/mg creat   Noroxymorphone                 919          EXPECTED   ng/mg creat    Sources of oxycodone are scheduled prescription medications.    Oxymorphone, noroxycodone, and noroxymorphone are expected    metabolites of oxycodone. Oxymorphone is also available as a    scheduled prescription  medication. ==================================================================== Test                      Result    Flag   Units      Ref Range   Creatinine              97               mg/dL      >=20 ==================================================================== Declared Medications:  The flagging and interpretation on this report are based on the  following declared medications.  Unexpected results may arise from  inaccuracies in the declared medications.  **Note: The testing scope of this panel includes these medications:  Oxycodone (Percocet)  **Note: The testing scope of this panel does not include following  reported medications:  Acetaminophen (Percocet)  Amitriptyline (Elavil)  Fluticasone (Flonase)  Hydrochlorothiazide  Ibuprofen  Levothyroxine  Magnesium Oxide  Polyethylene Glycol  Vitamin D2 (Drisdol)  Vitamin D3 ==================================================================== For clinical consultation, please call ([phone] ====================================================================    UDS interpretation: Compliant          Medication Assessment Form: Reviewed. Patient indicates being compliant with therapy Treatment compliance: Compliant Risk Assessment Profile: Aberrant behavior: See prior evaluations. None observed or detected today Comorbid factors increasing risk of overdose: See prior notes. No additional  risks detected today Risk of substance use disorder (SUD): Low Opioid Risk Tool (ORT) Total Score: 0  Interpretation Table:  Score <3 = Low Risk for SUD  Score between 4-7 = Moderate Risk for SUD  Score >8 = High Risk for Opioid Abuse   Risk Mitigation Strategies:  Patient Counseling: Covered Patient-Prescriber Agreement (PPA): Present and active  Notification to other healthcare providers: Done  Pharmacologic Plan: No change in therapy, at this time  Laboratory Chemistry  Inflammation Markers Lab Results  Component  Value Date   ESRSEDRATE 3 12/06/2015   CRP <0.5 12/06/2015   Renal Function Lab Results  Component Value Date   BUN 13 12/06/2015   CREATININE 0.75 12/06/2015   GFRAA >60 12/06/2015   GFRNONAA >60 12/06/2015   Hepatic Function Lab Results  Component Value Date   AST 19 12/06/2015   ALT 17 12/06/2015   ALBUMIN 4.6 12/06/2015   Electrolytes Lab Results  Component Value Date   NA 138 12/06/2015   K 3.7 12/06/2015   CL 101 12/06/2015   CALCIUM 9.6 12/06/2015   MG 1.6 (L) 12/06/2015   Pain Modulating Vitamins Lab Results  Component Value Date   VD25OH 20.9 (L) 12/06/2015   VD125OH2TOT 28.6 12/06/2015   VITAMINB12 306 12/06/2015   Coagulation Parameters Lab Results  Component Value Date   PLT 192 05/17/2013   Cardiovascular Lab Results  Component Value Date   HGB 12.4 05/17/2013   HCT 35.7 05/17/2013   Note: Lab results reviewed.  Recent Diagnostic Imaging Review  No results found. Note: Imaging results reviewed.          Meds  The patient has a current medication list which includes the following prescription(s): vitamin d3, fluticasone, hydrochlorothiazide, ibuprofen, levothyroxine, magnesium oxide, oxycodone-acetaminophen, oxycodone-acetaminophen, oxycodone-acetaminophen, and polyethylene glycol powder.  Current Outpatient Prescriptions on File Prior to Visit  Medication Sig  . Cholecalciferol (VITAMIN D3) 2000 units capsule Take 1 capsule (2,000 Units total) by mouth daily.  . fluticasone (FLONASE) 50 MCG/ACT nasal spray instill 1 to 2 sprays into each nostril once daily  . hydrochlorothiazide (HYDRODIURIL) 25 MG tablet Take 1 tablet by mouth daily at 2 PM daily at 2 PM.  . ibuprofen (ADVIL,MOTRIN) 200 MG tablet Take 800 mg by mouth every 8 (eight) hours as needed.  Mar[place] Kitchen levothyroxine (SYNTHROID, LEVOTHROID) 100 MCG tablet Take 100 mcg by mouth daily before breakfast.  . Magnesium Oxide 500 MG CAPS Take 1 capsule (500 mg total) by mouth daily.  . polyethylene  glycol powder (GLYCOLAX/MIRALAX) powder take 17GM (DISSOLVED IN WATER) by mouth once daily   No current facility-administered medications on file prior to visit.    ROS  Constitutional: Denies any fever or chills Gastrointestinal: No reported hemesis, hematochezia, vomiting, or acute GI distress Musculoskeletal: Denies any acute onset joint swelling, redness, loss of ROM, or weakness Neurological: No reported episodes of acute onset apraxia, aphasia, dysarthria, agnosia, amnesia, paralysis, loss of coordination, or loss of consciousness  Allergies  Ms. Moomaw has No Known Allergies.  [place]  Drug: Ms. Encarnacion  reports that she does not use drugs. Alcohol:  reports that she drinks alcohol. Tobacco:  reports that she has been smoking Cigarettes.  She has a 30.00 pack-year smoking history. She has never used smokeless tobacco. Medical:  has a past medical history of Arthritis and Asthma. Family: family history includes Diabetes in her father and mother; Heart disease in her father.  Past Surgical History:  Procedure Laterality Date  . CESAREAN SECTION    .  CHOLECYSTECTOMY    . TUBAL LIGATION     Constitutional Exam  General appearance: Well nourished, well developed, and well hydrated. In no apparent acute distress Vitals:   11/25/16 0827  BP: (!) 146/79  Pulse: 66  Resp: 16  Temp: 98.5 F (36.9 C)  TempSrc: Oral  SpO2: 99%  Weight: 145 lb (65.8 kg)  Height: 4' 10" (1.473 m)   BMI Assessment: Estimated body mass index is 30.31 kg/m as calculated from the following:   Height as of this encounter: 4' 10" (1.473 m).   Weight as of this encounter: 145 lb (65.8 kg).  BMI interpretation table: BMI level Category Range association with higher incidence of chronic pain  <18 kg/m2 Underweight   18.5-24.9 kg/m2 Ideal body weight   25-29.9 kg/m2 Overweight Increased incidence by 20%  30-34.9 kg/m2 Obese (Class I) Increased incidence by 68%  35-39.9 kg/m2 Severe obesity (Class II)  Increased incidence by 136%  >40 kg/m2 Extreme obesity (Class III) Increased incidence by 254%   BMI Readings from Last 4 Encounters:  11/25/16 30.31 kg/m  08/28/16 29.26 kg/m  06/04/16 31.35 kg/m  03/05/16 30.31 kg/m   Wt Readings from Last 4 Encounters:  11/25/16 145 lb (65.8 kg)  08/28/16 140 lb (63.5 kg)  06/04/16 150 lb (68 kg)  03/05/16 145 lb (65.8 kg)  Psych/Mental status: Alert, oriented x 3 (person, place, & time)       Eyes: PERLA Respiratory: No evidence of acute respiratory distress  Cervical Spine Exam  Inspection: No masses, redness, or swelling Alignment: Symmetrical Functional ROM: Unrestricted ROM Stability: No instability detected Muscle strength & Tone: Functionally intact Sensory: Unimpaired Palpation: Non-contributory  Upper Extremity (UE) Exam    Side: Right upper extremity  Side: Left upper extremity  Inspection: No masses, redness, swelling, or asymmetry. No contractures  Inspection: No masses, redness, swelling, or asymmetry. No contractures  Functional ROM: Unrestricted ROM          Functional ROM: Unrestricted ROM          Muscle strength & Tone: Functionally intact  Muscle strength & Tone: Functionally intact  Sensory: Unimpaired  Sensory: Unimpaired  Palpation: Euthermic  Palpation: Euthermic  Specialized Test(s): Deferred         Specialized Test(s): Deferred          Thoracic Spine Exam  Inspection: No masses, redness, or swelling Alignment: Symmetrical Functional ROM: Unrestricted ROM Stability: No instability detected Sensory: Unimpaired Muscle strength & Tone: Functionally intact Palpation: Non-contributory  Lumbar Spine Exam  Inspection: No masses, redness, or swelling Alignment: Symmetrical Functional ROM: Unrestricted ROM Stability: No instability detected Muscle strength & Tone: Functionally intact Sensory: Unimpaired Palpation: Non-contributory Provocative Tests: Lumbar Hyperextension and rotation test: evaluation  deferred today       Patrick's Maneuver: evaluation deferred today              Gait & Posture Assessment  Ambulation: Unassisted Gait: Relatively normal for age and body habitus Posture: WNL   Lower Extremity Exam    Side: Right lower extremity  Side: Left lower extremity  Inspection: No masses, redness, swelling, or asymmetry. No contractures  Inspection: No masses, redness, swelling, or asymmetry. No contractures  Functional ROM: Unrestricted ROM          Functional ROM: Unrestricted ROM          Muscle strength & Tone: Functionally intact  Muscle strength & Tone: Functionally intact  Sensory: Unimpaired  Sensory: Unimpaired  Palpation: No palpable anomalies  Palpation: No palpable anomalies   Assessment  Primary Diagnosis & Pertinent Problem List: The primary encounter diagnosis was Chronic pain syndrome. Diagnoses of Chronic low back pain (Location of Primary Source of Pain) (Bilateral) (L>R), Chronic abdominal pain (Location of Secondary source of pain) (Epigastric), Chronic hip pain, unspecified laterality, Lumbar facet syndrome (Location of Primary Source of Pain) (Bilateral) (L>R), Chronic knee pain (Right), Osteoarthritis of knee (Right), Chronic pain of both lower extremities, and Chronic radicular pain of lower extremity were also pertinent to this visit.  Status Diagnosis  Controlled Controlled Controlled 1. Chronic pain syndrome   2. Chronic low back pain (Location of Primary Source of Pain) (Bilateral) (L>R)   3. Chronic abdominal pain (Location of Secondary source of pain) (Epigastric)   4. Chronic hip pain, unspecified laterality   5. Lumbar facet syndrome (Location of Primary Source of Pain) (Bilateral) (L>R)   6. Chronic knee pain (Right)   7. Osteoarthritis of knee (Right)   8. Chronic pain of both lower extremities   9. Chronic radicular pain of lower extremity      Plan of Care  Pharmacotherapy (Medications Ordered): Meds ordered this encounter  Medications   . oxyCODONE-acetaminophen (PERCOCET) 10-325 MG tablet    Sig: Take 1 tablet by mouth every 8 (eight) hours as needed for pain.    Dispense:  90 tablet    Refill:  0    Do not place this medication, or any other prescription from our practice, on "Automatic Refill". Patient may have prescription filled one day early if pharmacy is closed on scheduled refill date. Do not fill until: 12/31/16 To last until: 01/30/17  . oxyCODONE-acetaminophen (PERCOCET) 10-325 MG tablet    Sig: Take 1 tablet by mouth every 8 (eight) hours as needed for pain.    Dispense:  90 tablet    Refill:  0    Do not place this medication, or any other prescription from our practice, on "Automatic Refill". Patient may have prescription filled one day early if pharmacy is closed on scheduled refill date. Do not fill until: 12/01/16 To last until: 12/31/16  . oxyCODONE-acetaminophen (PERCOCET) 10-325 MG tablet    Sig: Take 1 tablet by mouth every 8 (eight) hours as needed for pain.    Dispense:  90 tablet    Refill:  0    Do not place this medication, or any other prescription from our practice, on "Automatic Refill". Patient may have prescription filled one day early if pharmacy is closed on scheduled refill date. Do not fill until: 01/30/17 To last until: 03/01/17   New Prescriptions   No medications on file   Medications administered today: Ms. Statzer had no medications administered during this visit. Lab-work, procedure(s), and/or referral(s): Orders Placed This Encounter  Procedures  . KNEE INJECTION  . MR LUMBAR SPINE WO CONTRAST   Imaging and/or referral(s): MR LUMBAR SPINE WO CONTRAST  Interventional therapies: Planned, scheduled, and/or pending:   Diagnostic Right intra-articular knee injection   Considering:   Diagnostic, bilateral celiac plexus block Diagnostic bilateral intra-articular hip joint injection Diagnostic bilateral lumbar facet block Possible bilateral lumbar facet  radiofrequencyablation. Diagnostic left sided lumbar epidural steroid injection Diagnostic superior hypogastric nerve block Diagnostic right-sided carpal tunnel injection   Palliative PRN treatment(s):   Diagnostic, bilateral celiac plexus block Diagnostic bilateral intra-articular hip joint injection Diagnostic bilateral lumbar facet block Diagnostic left sided lumbar epidural steroid injection Diagnostic superior hypogastric nerve block Diagnostic right-sided carpal tunnel injection   Provider-requested follow-up: Return in  about 3 months (around 02/22/2017) for (Nurse Practitioner) Med-Mgmt, in addition, (PRN) procedure.  Return 2 wks after MRI for review..  No future appointments. Primary Care Physician: Christie Nottingham, PA Location: [facility] Outpatient [facility] Management Facility Note by: Kathlen Brunswick. Dossie Arbour, M.D, DABA, DABAPM, DABPM, DABIPP, FIPP Date: 11/25/2016; Time: 9:56 AM  Pain Score Disclaimer: We use the NRS-11 scale. This is a self-reported, subjective measurement of pain severity with only modest accuracy. It is used primarily to identify changes within a particular patient. It must be understood that outpatient pain scales are significantly less accurate that those used for research, where they can be applied under ideal controlled circumstances with minimal exposure to variables. In reality, the score is likely to be a combination of pain intensity and pain affect, where pain affect describes the degree of emotional arousal or changes in action readiness caused by the sensory experience of pain. Factors such as social and work situation, setting, emotional state, anxiety levels, expectation, and prior pain experience may influence pain perception and show large inter-individual differences that may also be affected by time variables.  Patient instructions provided during this appointment: Patient Instructions   Pain Score  Introduction: The pain score used by this practice  is the Verbal Numerical Rating Scale (VNRS-11). This is an 11-point scale. It is for adults and children 10 years or older. There are significant differences in how the pain score is reported, used, and applied. Forget everything you learned in the past and learn this scoring system.  General Information: The scale should reflect your current level of pain. Unless you are specifically asked for the level of your worst pain, or your average pain. If you are asked for one of these two, then it should be understood that it is over the past 24 hours.  Basic Activities of Daily Living (ADL): Personal hygiene, dressing, eating, transferring, and using restroom.  Instructions: Most patients tend to report their level of pain as a combination of two factors, their physical pain and their psychosocial pain. This last one is also known as "suffering" and it is reflection of how physical pain affects you socially and psychologically. From now on, report them separately. From this point on, when asked to report your pain level, report only your physical pain. Use the following table for reference.  Pain Clinic Pain Levels (0-5/10)  Pain Level Score Description  No Pain 0   Mild pain 1 Nagging, annoying, but does not interfere with basic activities of daily living (ADL). Patients are able to eat, bathe, get dressed, toileting (being able to get on and off the toilet and perform personal hygiene functions), transfer (move in and out of bed or a chair without assistance), and maintain continence (able to control bladder and bowel functions). Blood pressure and heart rate are unaffected. A normal heart rate for a healthy adult ranges from 60 to 100 bpm (beats per minute).   Mild to moderate pain 2 Noticeable and distracting. Impossible to hide from other people. More frequent flare-ups. Still possible to adapt and function close to normal. It can be very annoying and may have occasional stronger flare-ups. With  discipline, patients may get used to it and adapt.   Moderate pain 3 Interferes significantly with activities of daily living (ADL). It becomes difficult to feed, bathe, get dressed, get on and off the toilet or to perform personal hygiene functions. Difficult to get in and out of bed or a chair without assistance. Very distracting. With effort,  it can be ignored when deeply involved in activities.   Moderately severe pain 4 Impossible to ignore for more than a few minutes. With effort, patients may still be able to manage work or participate in some social activities. Very difficult to concentrate. Signs of autonomic nervous system discharge are evident: dilated pupils (mydriasis); mild sweating (diaphoresis); sleep interference. Heart rate becomes elevated (>115 bpm). Diastolic blood pressure (lower number) rises above 100 mmHg. Patients find relief in laying down and not moving.   Severe pain 5 Intense and extremely unpleasant. Associated with frowning face and frequent crying. Pain overwhelms the senses.  Ability to do any activity or maintain social relationships becomes significantly limited. Conversation becomes difficult. Pacing back and forth is common, as getting into a comfortable position is nearly impossible. Pain wakes you up from deep sleep. Physical signs will be obvious: pupillary dilation; increased sweating; goosebumps; brisk reflexes; cold, clammy hands and feet; nausea, vomiting or dry heaves; loss of appetite; significant sleep disturbance with inability to fall asleep or to remain asleep. When persistent, significant weight loss is observed due to the complete loss of appetite and sleep deprivation.  Blood pressure and heart rate becomes significantly elevated. Caution: If elevated blood pressure triggers a pounding headache associated with blurred vision, then the patient should immediately seek attention at an urgent or emergency care unit, as these may be signs of an impending  stroke.    Emergency Department Pain Levels (6-10/10)  Emergency Room Pain 6 Severely limiting. Requires emergency care and should not be seen or managed at an outpatient pain management facility. Communication becomes difficult and requires great effort. Assistance to reach the emergency department may be required. Facial flushing and profuse sweating along with potentially dangerous increases in heart rate and blood pressure will be evident.   Distressing pain 7 Self-care is very difficult. Assistance is required to transport, or use restroom. Assistance to reach the emergency department will be required. Tasks requiring coordination, such as bathing and getting dressed become very difficult.   Disabling pain 8 Self-care is no longer possible. At this level, pain is disabling. The individual is unable to do even the most "basic" activities such as walking, eating, bathing, dressing, transferring to a bed, or toileting. Fine motor skills are lost. It is difficult to think clearly.   Incapacitating pain 9 Pain becomes incapacitating. Thought processing is no longer possible. Difficult to remember your own name. Control of movement and coordination are lost.   The worst pain imaginable 10 At this level, most patients pass out from pain. When this level is reached, collapse of the autonomic nervous system occurs, leading to a sudden drop in blood pressure and heart rate. This in turn results in a temporary and dramatic drop in blood flow to the brain, leading to a loss of consciousness. Fainting is one of the body's self defense mechanisms. Passing out puts the brain in a calmed state and causes it to shut down for a while, in order to begin the healing process.    Summary: 1. Refer to this scale when providing [place] with your pain level. 2. Be accurate and careful when reporting your pain level. This will help with your care. 3. Over-reporting your pain level will lead to loss of credibility. 4. Even  a level of 1/10 means that there is pain and will be treated at our facility. 5. High, inaccurate reporting will be documented as "Symptom Exaggeration", leading to loss of credibility and suspicions of possible secondary  gains such as obtaining more narcotics, or wanting to appear disabled, for fraudulent reasons. 6. Only pain levels of 5 or below will be seen at our facility. 7. Pain levels of 6 and above will be sent to the Emergency Department and the appointment cancelled.  You were given 3 prescriptions for Oxycodone today. _____________________________________________________________________________________________

## 2016-11-25 ENCOUNTER — Ambulatory Visit: Payer: BLUE CROSS/BLUE SHIELD | Attending: Pain Medicine | Admitting: Pain Medicine

## 2016-11-25 ENCOUNTER — Encounter: Payer: Self-pay | Admitting: Pain Medicine

## 2016-11-25 VITALS — BP 146/79 | HR 66 | Temp 98.5°F | Resp 16 | Ht <= 58 in | Wt 145.0 lb

## 2016-11-25 DIAGNOSIS — M545 Low back pain: Secondary | ICD-10-CM

## 2016-11-25 DIAGNOSIS — R109 Unspecified abdominal pain: Secondary | ICD-10-CM

## 2016-11-25 DIAGNOSIS — Z79891 Long term (current) use of opiate analgesic: Secondary | ICD-10-CM | POA: Insufficient documentation

## 2016-11-25 DIAGNOSIS — Z5181 Encounter for therapeutic drug level monitoring: Secondary | ICD-10-CM | POA: Diagnosis not present

## 2016-11-25 DIAGNOSIS — F17210 Nicotine dependence, cigarettes, uncomplicated: Secondary | ICD-10-CM | POA: Insufficient documentation

## 2016-11-25 DIAGNOSIS — R1084 Generalized abdominal pain: Secondary | ICD-10-CM | POA: Diagnosis not present

## 2016-11-25 DIAGNOSIS — M5410 Radiculopathy, site unspecified: Secondary | ICD-10-CM

## 2016-11-25 DIAGNOSIS — M25552 Pain in left hip: Secondary | ICD-10-CM | POA: Diagnosis not present

## 2016-11-25 DIAGNOSIS — M1711 Unilateral primary osteoarthritis, right knee: Secondary | ICD-10-CM | POA: Diagnosis not present

## 2016-11-25 DIAGNOSIS — M25559 Pain in unspecified hip: Secondary | ICD-10-CM

## 2016-11-25 DIAGNOSIS — M79605 Pain in left leg: Secondary | ICD-10-CM | POA: Diagnosis not present

## 2016-11-25 DIAGNOSIS — M25561 Pain in right knee: Secondary | ICD-10-CM | POA: Diagnosis not present

## 2016-11-25 DIAGNOSIS — M25551 Pain in right hip: Secondary | ICD-10-CM | POA: Diagnosis not present

## 2016-11-25 DIAGNOSIS — M79604 Pain in right leg: Secondary | ICD-10-CM

## 2016-11-25 DIAGNOSIS — R1013 Epigastric pain: Secondary | ICD-10-CM | POA: Insufficient documentation

## 2016-11-25 DIAGNOSIS — G8929 Other chronic pain: Secondary | ICD-10-CM

## 2016-11-25 DIAGNOSIS — M1288 Other specific arthropathies, not elsewhere classified, other specified site: Secondary | ICD-10-CM | POA: Diagnosis not present

## 2016-11-25 DIAGNOSIS — G894 Chronic pain syndrome: Secondary | ICD-10-CM

## 2016-11-25 DIAGNOSIS — Z79899 Other long term (current) drug therapy: Secondary | ICD-10-CM | POA: Insufficient documentation

## 2016-11-25 DIAGNOSIS — M47816 Spondylosis without myelopathy or radiculopathy, lumbar region: Secondary | ICD-10-CM

## 2016-11-25 MED ORDER — OXYCODONE-ACETAMINOPHEN 10-325 MG PO TABS: 1.0000 | ORAL_TABLET | Freq: Three times a day (TID) | ORAL | 0 refills | Status: DC | PRN

## 2016-11-25 NOTE — Progress Notes (Signed)
Nursing Pain Medication Assessment:  Safety precautions to be maintained throughout the outpatient stay will include: orient to surroundings, keep bed in low position, maintain call bell within reach at all times, provide assistance with transfer out of bed and ambulation.  Medication Inspection Compliance: Pill count conducted under aseptic conditions, in front of the patient. Neither the pills nor the bottle was removed from the patient's sight at any time. Once count was completed pills were immediately returned to the patient in their original bottle.  Medication: Oxycodone/APAP Pill/Patch Count: 0 of 90 pills remain Bottle Appearance: Standard pharmacy container. Clearly labeled. Filled Date: 02 / 03 / 2018 Last Medication intake:  Today

## 2016-11-25 NOTE — Patient Instructions (Addendum)
Pain Score  Introduction: The pain score used by this practice is the Verbal Numerical Rating Scale (VNRS-11). This is an 11-point scale. It is for adults and children 10 years or older. There are significant differences in how the pain score is reported, used, and applied. Forget everything you learned in the past and learn this scoring system.  General Information: The scale should reflect your current level of pain. Unless you are specifically asked for the level of your worst pain, or your average pain. If you are asked for one of these two, then it should be understood that it is over the past 24 hours.  Basic Activities of Daily Living (ADL): Personal hygiene, dressing, eating, transferring, and using restroom.  Instructions: Most patients tend to report their level of pain as a combination of two factors, their physical pain and their psychosocial pain. This last one is also known as "suffering" and it is reflection of how physical pain affects you socially and psychologically. From now on, report them separately. From this point on, when asked to report your pain level, report only your physical pain. Use the following table for reference.  Pain Clinic Pain Levels (0-5/10)  Pain Level Score Description  No Pain 0   Mild pain 1 Nagging, annoying, but does not interfere with basic activities of daily living (ADL). Patients are able to eat, bathe, get dressed, toileting (being able to get on and off the toilet and perform personal hygiene functions), transfer (move in and out of bed or a chair without assistance), and maintain continence (able to control bladder and bowel functions). Blood pressure and heart rate are unaffected. A normal heart rate for a healthy adult ranges from 60 to 100 bpm (beats per minute).   Mild to moderate pain 2 Noticeable and distracting. Impossible to hide from other people. More frequent flare-ups. Still possible to adapt and function close to normal. It can be very  annoying and may have occasional stronger flare-ups. With discipline, patients may get used to it and adapt.   Moderate pain 3 Interferes significantly with activities of daily living (ADL). It becomes difficult to feed, bathe, get dressed, get on and off the toilet or to perform personal hygiene functions. Difficult to get in and out of bed or a chair without assistance. Very distracting. With effort, it can be ignored when deeply involved in activities.   Moderately severe pain 4 Impossible to ignore for more than a few minutes. With effort, patients may still be able to manage work or participate in some social activities. Very difficult to concentrate. Signs of autonomic nervous system discharge are evident: dilated pupils (mydriasis); mild sweating (diaphoresis); sleep interference. Heart rate becomes elevated (>115 bpm). Diastolic blood pressure (lower number) rises above 100 mmHg. Patients find relief in laying down and not moving.   Severe pain 5 Intense and extremely unpleasant. Associated with frowning face and frequent crying. Pain overwhelms the senses.  Ability to do any activity or maintain social relationships becomes significantly limited. Conversation becomes difficult. Pacing back and forth is common, as getting into a comfortable position is nearly impossible. Pain wakes you up from deep sleep. Physical signs will be obvious: pupillary dilation; increased sweating; goosebumps; brisk reflexes; cold, clammy hands and feet; nausea, vomiting or dry heaves; loss of appetite; significant sleep disturbance with inability to fall asleep or to remain asleep. When persistent, significant weight loss is observed due to the complete loss of appetite and sleep deprivation.  Blood pressure and heart   rate becomes significantly elevated. Caution: If elevated blood pressure triggers a pounding headache associated with blurred vision, then the patient should immediately seek attention at an urgent or  emergency care unit, as these may be signs of an impending stroke.    Emergency Department Pain Levels (6-10/10)  Emergency Room Pain 6 Severely limiting. Requires emergency care and should not be seen or managed at an outpatient pain management facility. Communication becomes difficult and requires great effort. Assistance to reach the emergency department may be required. Facial flushing and profuse sweating along with potentially dangerous increases in heart rate and blood pressure will be evident.   Distressing pain 7 Self-care is very difficult. Assistance is required to transport, or use restroom. Assistance to reach the emergency department will be required. Tasks requiring coordination, such as bathing and getting dressed become very difficult.   Disabling pain 8 Self-care is no longer possible. At this level, pain is disabling. The individual is unable to do even the most "basic" activities such as walking, eating, bathing, dressing, transferring to a bed, or toileting. Fine motor skills are lost. It is difficult to think clearly.   Incapacitating pain 9 Pain becomes incapacitating. Thought processing is no longer possible. Difficult to remember your own name. Control of movement and coordination are lost.   The worst pain imaginable 10 At this level, most patients pass out from pain. When this level is reached, collapse of the autonomic nervous system occurs, leading to a sudden drop in blood pressure and heart rate. This in turn results in a temporary and dramatic drop in blood flow to the brain, leading to a loss of consciousness. Fainting is one of the body's self defense mechanisms. Passing out puts the brain in a calmed state and causes it to shut down for a while, in order to begin the healing process.    Summary: 1. Refer to this scale when providing us with your pain level. 2. Be accurate and careful when reporting your pain level. This will help with your care. 3. Over-reporting  your pain level will lead to loss of credibility. 4. Even a level of 1/10 means that there is pain and will be treated at our facility. 5. High, inaccurate reporting will be documented as "Symptom Exaggeration", leading to loss of credibility and suspicions of possible secondary gains such as obtaining more narcotics, or wanting to appear disabled, for fraudulent reasons. 6. Only pain levels of 5 or below will be seen at our facility. 7. Pain levels of 6 and above will be sent to the Emergency Department and the appointment cancelled.  You were given 3 prescriptions for Oxycodone today. _____________________________________________________________________________________________   

## 2016-12-05 ENCOUNTER — Telehealth: Payer: Self-pay | Admitting: *Deleted

## 2016-12-09 ENCOUNTER — Telehealth: Payer: Self-pay

## 2016-12-09 NOTE — Telephone Encounter (Signed)
I called patient to let her know that I have been trying to get prior auth completed and I have been unsuccessful. Every time I attempt to get prior auth started the phone hangs up. I have called at least 10 times or more since last week. I have tried other numbers as well.

## 2016-12-23 ENCOUNTER — Ambulatory Visit: Payer: BLUE CROSS/BLUE SHIELD

## 2016-12-25 ENCOUNTER — Ambulatory Visit: Payer: BLUE CROSS/BLUE SHIELD

## 2017-01-06 ENCOUNTER — Ambulatory Visit: Payer: BLUE CROSS/BLUE SHIELD

## 2017-01-16 ENCOUNTER — Ambulatory Visit
Admission: RE | Admit: 2017-01-16 | Discharge: 2017-01-16 | Disposition: A | Discharge status: Home / Self Care | Payer: BLUE CROSS/BLUE SHIELD | Source: Ambulatory Visit | Attending: Pain Medicine | Admitting: Pain Medicine

## 2017-01-16 ENCOUNTER — Encounter: Payer: Self-pay | Admitting: Radiology

## 2017-01-16 DIAGNOSIS — M79604 Pain in right leg: Secondary | ICD-10-CM | POA: Diagnosis not present

## 2017-01-16 DIAGNOSIS — M545 Low back pain: Secondary | ICD-10-CM | POA: Diagnosis not present

## 2017-01-16 DIAGNOSIS — G8929 Other chronic pain: Secondary | ICD-10-CM

## 2017-01-16 DIAGNOSIS — M79605 Pain in left leg: Secondary | ICD-10-CM | POA: Insufficient documentation

## 2017-01-16 DIAGNOSIS — M48061 Spinal stenosis, lumbar region without neurogenic claudication: Secondary | ICD-10-CM | POA: Diagnosis not present

## 2017-01-16 DIAGNOSIS — M5410 Radiculopathy, site unspecified: Secondary | ICD-10-CM

## 2017-01-16 DIAGNOSIS — M5416 Radiculopathy, lumbar region: Secondary | ICD-10-CM | POA: Insufficient documentation

## 2017-01-29 ENCOUNTER — Ambulatory Visit: Payer: BLUE CROSS/BLUE SHIELD | Admitting: Pain Medicine

## 2017-02-16 NOTE — Progress Notes (Signed)
Patient's Name: Vicki Fox  MRN: [id number]  Referring Provider: Christie Nottingham, [place]  DOB: 1966-05-12  PCP: Christie Nottingham, PA  DOS: 03/02/2017  Note by: Vevelyn Francois NP  Service setting: Ambulatory outpatient  Specialty: Interventional Pain Management  Location: [facility] (AMB) Pain Management Facility    Patient type: Established    Primary Reason(s) for Visit: Encounter for prescription drug management (Level of risk: moderate) CC: Back Pain (lower) and Abdominal Pain (middle)  HPI  Vicki Fox is a 51 y.o. year old, female patient, who comes today for a medication management evaluation. She has Fibromyalgia; Essential hypertension; Hypothyroidism; Vitamin D deficiency; Chronic abdominal pain (Location of Secondary source of pain) (Epigastric); CN (constipation); Overweight (BMI 25.0-29.9); FH: diabetes mellitus; Long term current use of opiate analgesic; Long term prescription opiate use; Opiate use (45 MME/Day); Encounter for therapeutic drug level monitoring; Encounter for chronic pain management; Depression; Gallbladder disease; Adhesions Pelvic; Generalized abdominal pain (Location of Secondary source of pain) (Epigastric); Adhesions Abdominal; Chronic pelvic pain in female; Chronic low back pain (Location of Primary Source of Pain) (Bilateral) (L>R); Lumbar spondylosis; Chronic lower extremity pain (Bilateral) (L>R); Chronic radicular pain of lower extremity(2) (B) (S1) (L>R); Osteoarthritis of hip (Location of Tertiary source of pain) (Bilateral) (L>R); Chronic hip pain (Location of Tertiary source of pain) (Bilateral) (R>L); CRP elevated; Thyroid nodule; COPD with emphysema ([place]); Nicotine dependence; Hypomagnesemia; Tobacco abuse; Neuropathy; Neurogenic pain; Neuropathic pain; Right carpal tunnel syndrome; Lumbar facet syndrome (Location of Primary Source of Pain) (Bilateral) (L>R); Opioid-induced constipation (OIC); Mechanical knee pain, right; Chronic pain syndrome; Chronic knee pain (Right);  Osteoarthritis of knee (Right); and Lumbar stenosis without neurogenic claudication on her problem list. Her primarily concern today is the Back Pain (lower) and Abdominal Pain (middle)  Pain Assessment: Self-Reported Pain Score: 4 /10             Reported level is compatible with observation.       Pain Type: Chronic pain Pain Location: Back Pain Orientation: Lower, Right, Left Pain Descriptors / Indicators: Aching, Constant, Sharp Pain Frequency: Constant  Vicki Fox was last scheduled for an appointment on 11/25/16 for medication management. During today's appointment we reviewed Vicki Fox's chronic pain status, as well as her outpatient medication regimen. She has chronic low back pain. She has radicular symptoms that do down into both hips. She describes the pain as being sharp. She denies any numbness tingling or weakness. She denies one side being worse than the other. She admits that she had an MRI and is interested in the results.  The patient  reports that she does not use drugs. Her body mass index is 28.84 kg/m.  Further details on both, my assessment(s), as well as the proposed treatment plan, please see below.  Controlled Substance Pharmacotherapy Assessment REMS (Risk Evaluation and Mitigation Strategy)  Analgesic:Oxycodone/APAP 10/325 one tablet every 8 hours when necessary (30 mg/day) MME/day:45 mg/day Ignatius Specking, RN  03/02/2017  9:55 AM  Sign at close encounter Nursing Pain Medication Assessment:  Safety precautions to be maintained throughout the outpatient stay will include: orient to surroundings, keep bed in low position, maintain call bell within reach at all times, provide assistance with transfer out of bed and ambulation.  Medication Inspection Compliance: Pill count conducted under aseptic conditions, in front of the patient. Neither the pills nor the bottle was removed from the patient's sight at any time. Once count was completed pills were immediately  returned to the patient in their  original bottle.  Medication: See above Pill/Patch Count: 0 of 90 pills remain Pill/Patch Appearance: Markings consistent with prescribed medication Bottle Appearance: Standard pharmacy container. Clearly labeled. Filled Date: 5 / 04 / 2018 Last Medication intake:  Today   Pharmacokinetics: Liberation and absorption (onset of action): WNL Distribution (time to peak effect): WNL Metabolism and excretion (duration of action): WNL         Pharmacodynamics: Desired effects: Analgesia: Vicki Fox reports >50% benefit. Functional ability: Patient reports that medication allows her to accomplish basic ADLs Clinically meaningful improvement in function (CMIF): Sustained CMIF goals met Perceived effectiveness: Described as relatively effective, allowing for increase in activities of daily living (ADL) Undesirable effects: Side-effects or Adverse reactions: None reported Monitoring: [place] PMP: Online review of the past 18-monthperiod conducted. Compliant with practice rules and regulations List of all UDS test(s) done:  Lab Results  Component Value Date   TOXASSSELUR FINAL 03/05/2016   TOXASSSELUR FINAL 12/06/2015   TOXASSSELUR FINAL 09/05/2015   Last UDS on record: ToxAssure Select 13  Date Value Ref Range Status  03/05/2016 FINAL  Final    Comment:    ==================================================================== TOXASSURE SELECT 13 (MW) ==================================================================== Test                             Result       Flag       Units Drug Present and Declared for Prescription Verification   Oxycodone                      2828         EXPECTED   ng/mg creat   Oxymorphone                    5114         EXPECTED   ng/mg creat   Noroxycodone                   7706         EXPECTED   ng/mg creat   Noroxymorphone                 919          EXPECTED   ng/mg creat    Sources of oxycodone are scheduled prescription  medications.    Oxymorphone, noroxycodone, and noroxymorphone are expected    metabolites of oxycodone. Oxymorphone is also available as a    scheduled prescription medication. ==================================================================== Test                      Result    Flag   Units      Ref Range   Creatinine              97               mg/dL      >=20 ==================================================================== Declared Medications:  The flagging and interpretation on this report are based on the  following declared medications.  Unexpected results may arise from  inaccuracies in the declared medications.  **Note: The testing scope of this panel includes these medications:  Oxycodone (Percocet)  **Note: The testing scope of this panel does not include following  reported medications:  Acetaminophen (Percocet)  Amitriptyline (Elavil)  Fluticasone (Flonase)  Hydrochlorothiazide  Ibuprofen  Levothyroxine  Magnesium Oxide  Polyethylene Glycol  Vitamin D2 (Drisdol)  Vitamin D3 ==================================================================== For  clinical consultation, please call [phone]. ====================================================================    UDS interpretation: Compliant          Medication Assessment Form: Reviewed. Patient indicates being compliant with therapy Treatment compliance: Compliant Risk Assessment Profile: Aberrant behavior: See prior evaluations. None observed or detected today Comorbid factors increasing risk of overdose: See prior notes. No additional risks detected today Risk of substance use disorder (SUD): Low Opioid Risk Tool (ORT) Total Score: 0  Interpretation Table:  Score <3 = Low Risk for SUD  Score between 4-7 = Moderate Risk for SUD  Score >8 = High Risk for Opioid Abuse   Risk Mitigation Strategies:  Patient Counseling: Covered Patient-Prescriber Agreement (PPA): Present and active  Notification  to other healthcare providers: Done  Pharmacologic Plan: No change in therapy, at this time  Laboratory Chemistry  Inflammation Markers Lab Results  Component Value Date   CRP <0.5 12/06/2015   ESRSEDRATE 3 12/06/2015   (CRP: Acute Phase) (ESR: Chronic Phase) Renal Function Markers Lab Results  Component Value Date   BUN 13 12/06/2015   CREATININE 0.75 12/06/2015   GFRAA >60 12/06/2015   GFRNONAA >60 12/06/2015   Hepatic Function Markers Lab Results  Component Value Date   AST 19 12/06/2015   ALT 17 12/06/2015   ALBUMIN 4.6 12/06/2015   ALKPHOS 74 12/06/2015   Electrolytes Lab Results  Component Value Date   NA 138 12/06/2015   K 3.7 12/06/2015   CL 101 12/06/2015   CALCIUM 9.6 12/06/2015   MG 1.6 (L) 12/06/2015   Neuropathy Markers Lab Results  Component Value Date   VITAMINB12 306 12/06/2015   Bone Pathology Markers Lab Results  Component Value Date   ALKPHOS 74 12/06/2015   VD25OH 20.9 (L) 12/06/2015   VD125OH2TOT 28.6 12/06/2015   CALCIUM 9.6 12/06/2015   Coagulation Parameters Lab Results  Component Value Date   PLT 192 05/17/2013   Cardiovascular Markers Lab Results  Component Value Date   HGB 12.4 05/17/2013   HCT 35.7 05/17/2013   Note: Lab results reviewed.  Recent Diagnostic Imaging Review  Mr Lumbar Spine Wo Contrast  Result Date: 01/16/2017 CLINICAL DATA:  Chronic low back pain without sciatica EXAM: MRI LUMBAR SPINE WITHOUT CONTRAST TECHNIQUE: Multiplanar, multisequence MR imaging of the lumbar spine was performed. No intravenous contrast was administered. COMPARISON:  None. FINDINGS: Segmentation:  Normal Alignment:  Normal Vertebrae: Negative for fracture or mass. Hemangioma L1 vertebral body. Normal bone marrow. Conus medullaris: Extends to the L1-2 level and appears normal. Paraspinal and other soft tissues: Paraspinous muscles are well developed and symmetric. No retroperitoneal abnormality. Disc levels: L1-2:  Mild disc degeneration  L2-3:  Negative L3-4:  Mild disc and mild facet degeneration without stenosis L4-5: Disc degeneration with mild diffuse disc bulging. Moderate facet hypertrophy. Mild spinal stenosis. Mild subarticular stenosis on the left. L5-S1:  Mild facet degeneration without stenosis. IMPRESSION: Mild spinal stenosis L4-5 with subarticular stenosis on the left. No focal disc protrusion. Electronically Signed   By: Franchot Gallo M.D.   On: 01/16/2017 08:41   Note: Imaging results reviewed and explained to patient in Layman's terms. Copy of results provided to patient  Meds  The patient has a current medication list which includes the following prescription(s): vitamin d3, fluticasone, hydrochlorothiazide, ibuprofen, levothyroxine, magnesium oxide, polyethylene glycol powder, oxycodone-acetaminophen, oxycodone-acetaminophen, oxycodone-acetaminophen, and proair hfa.  Current Outpatient Prescriptions on File Prior to Visit  Medication Sig  . Cholecalciferol (VITAMIN D3) 2000 units capsule Take 1 capsule (2,000 Units total) by  mouth daily.  . fluticasone (FLONASE) 50 MCG/ACT nasal spray instill 1 to 2 sprays into each nostril once daily  . hydrochlorothiazide (HYDRODIURIL) 25 MG tablet Take 1 tablet by mouth daily at 2 PM daily at 2 PM.  . ibuprofen (ADVIL,MOTRIN) 200 MG tablet Take 800 mg by mouth every 8 (eight) hours as needed.  Marland Kitchen levothyroxine (SYNTHROID, LEVOTHROID) 100 MCG tablet Take 100 mcg by mouth daily before breakfast.  . Magnesium Oxide 500 MG CAPS Take 1 capsule (500 mg total) by mouth daily.   No current facility-administered medications on file prior to visit.    ROS  Constitutional: Denies any fever or chills Gastrointestinal: No reported hemesis, hematochezia, vomiting, or acute GI distress Musculoskeletal: Denies any acute onset joint swelling, redness, loss of ROM, or weakness Neurological: No reported episodes of acute onset apraxia, aphasia, dysarthria, agnosia, amnesia, paralysis, loss  of coordination, or loss of consciousness  Allergies  Vicki Fox has No Known Allergies.  [place]  Drug: Vicki Fox  reports that she does not use drugs. Alcohol:  reports that she drinks alcohol. Tobacco:  reports that she has been smoking Cigarettes.  She has a 30.00 pack-year smoking history. She has never used smokeless tobacco. Medical:  has a past medical history of Arthritis; Asthma; Flu; and Respiratory abnormalities. Family: family history includes Diabetes in her father and mother; Heart disease in her father.  Past Surgical History:  Procedure Laterality Date  . CESAREAN SECTION    . CHOLECYSTECTOMY    . TUBAL LIGATION     Constitutional Exam  General appearance: Well nourished, well developed, and well hydrated. In no apparent acute distress Vitals:   03/02/17 0942  BP: (!) 148/68  Pulse: 86  Resp: 16  SpO2: 97%  Weight: 138 lb (62.6 kg)  Height: 4' 10"  (1.473 m)   BMI Assessment: Estimated body mass index is 28.84 kg/m as calculated from the following:   Height as of this encounter: 4' 10"  (1.473 m).   Weight as of this encounter: 138 lb (62.6 kg).  BMI interpretation table: BMI level Category Range association with higher incidence of chronic pain  <18 kg/m2 Underweight   18.5-24.9 kg/m2 Ideal body weight   25-29.9 kg/m2 Overweight Increased incidence by 20%  30-34.9 kg/m2 Obese (Class I) Increased incidence by 68%  35-39.9 kg/m2 Severe obesity (Class II) Increased incidence by 136%  >40 kg/m2 Extreme obesity (Class III) Increased incidence by 254%   BMI Readings from Last 4 Encounters:  03/02/17 28.84 kg/m  11/25/16 30.31 kg/m  08/28/16 29.26 kg/m  06/04/16 31.35 kg/m   Wt Readings from Last 4 Encounters:  03/02/17 138 lb (62.6 kg)  11/25/16 145 lb (65.8 kg)  08/28/16 140 lb (63.5 kg)  06/04/16 150 lb (68 kg)  Psych/Mental status: Alert, oriented x 3 (person, place, & time)       Eyes: PERLA Respiratory: No evidence of acute respiratory  distress  Lumbar Spine Exam  Inspection: No masses, redness, or swelling Alignment: Symmetrical Functional ROM: Unrestricted ROM      Stability: No instability detected Muscle strength & Tone: Functionally intact Sensory: Unimpaired Palpation: Complains of area being tender to palpation       Provocative Tests: Lumbar Hyperextension and rotation test: evaluation deferred today       Patrick's Maneuver: evaluation deferred today for right-sided S-I arthralgia and for right hip arthralgia  Gait & Posture Assessment  Ambulation: Unassisted Gait: Relatively normal for age and body habitus Posture: WNL   Lower Extremity  Exam    Side: Right lower extremity  Side: Left lower extremity  Inspection: No masses, redness, swelling, or asymmetry. No contractures  Inspection: No masses, redness, swelling, or asymmetry. No contractures  Functional ROM: Unrestricted ROM          Functional ROM: Unrestricted ROM          Muscle strength & Tone: Functionally intact  Muscle strength & Tone: Functionally intact  Sensory: Unimpaired  Sensory: Unimpaired  Palpation: No palpable anomalies  Palpation: No palpable anomalies   Assessment  Primary Diagnosis & Pertinent Problem List: The primary encounter diagnosis was Lumbar spondylosis. Diagnoses of Lumbar stenosis without neurogenic claudication, Chronic pain syndrome, Opioid-induced constipation (OIC), and Chronic pain of right hip were also pertinent to this visit.  Status Diagnosis  Controlled Controlled Controlled 1. Lumbar spondylosis   2. Lumbar stenosis without neurogenic claudication   3. Chronic pain syndrome   4. Opioid-induced constipation (OIC)   5. Chronic pain of right hip     Problems updated and reviewed during this visit: Problem  Lumbar Stenosis Without Neurogenic Claudication  Chronic hip pain (Location of Tertiary source of pain) (Bilateral) (R>L)   Plan of Care  Pharmacotherapy (Medications Ordered): Meds ordered this  encounter  Medications  . oxyCODONE-acetaminophen (PERCOCET) 10-325 MG tablet    Sig: Take 1 tablet by mouth every 8 (eight) hours as needed for pain.    Dispense:  90 tablet    Refill:  0    Do not place this medication, or any other prescription from our practice, on "Automatic Refill". Patient may have prescription filled one day early if pharmacy is closed on scheduled refill date. Do not fill until: 04/30/17 To last until: 05/30/17    Order Specific Question:   Supervising Provider    Answer:   Milinda Pointer [phone]  . oxyCODONE-acetaminophen (PERCOCET) 10-325 MG tablet    Sig: Take 1 tablet by mouth every 8 (eight) hours as needed for pain.    Dispense:  90 tablet    Refill:  0    Do not place this medication, or any other prescription from our practice, on "Automatic Refill". Patient may have prescription filled one day early if pharmacy is closed on scheduled refill date. Do not fill until: 03/01/17 To last until: 03/31/17    Order Specific Question:   Supervising Provider    Answer:   Milinda Pointer [phone]  . oxyCODONE-acetaminophen (PERCOCET) 10-325 MG tablet    Sig: Take 1 tablet by mouth every 8 (eight) hours as needed for pain.    Dispense:  90 tablet    Refill:  0    Do not place this medication, or any other prescription from our practice, on "Automatic Refill". Patient may have prescription filled one day early if pharmacy is closed on scheduled refill date. Do not fill until: 03/31/17 To last until: 04/30/17    Order Specific Question:   Supervising Provider    Answer:   Milinda Pointer [phone]  . polyethylene glycol powder (GLYCOLAX/MIRALAX) powder    Sig: take 17GM (DISSOLVED IN WATER) by mouth once daily    Dispense:  255 g    Refill:  0    Order Specific Question:   Supervising Provider    AnswerMilinda Pointer [phone]   New Prescriptions   No medications on file   Medications administered today: Vicki Fox had no medications  administered during this visit. Lab-work, procedure(s), and/or referral(s): Orders Placed This Encounter  Procedures  .  DG HIP UNILAT W OR W/O PELVIS 2-3 VIEWS RIGHT   Imaging and/or referral(s): DG HIP UNILAT W OR W/O PELVIS 2-3 VIEWS RIGHT  Interventional therapies: Planned, scheduled, and/or pending:   Right-sided hip x-ray Continue with current regimen    Considering:   Diagnostic, bilateral celiac plexus block Diagnostic bilateral intra-articular hip joint injection Diagnostic bilateral lumbar facet block Possible bilateral lumbar facet radiofrequencyablation. Diagnostic left sided lumbar epidural steroid injection Diagnostic superior hypogastric nerve block Diagnostic right-sided carpal tunnel injection   Palliative PRN treatment(s):   Palliative, bilateral celiac plexus block Palliative bilateral intra-articular hip joint injection Palliative bilateral lumbar facet block Palliative left sided lumbar epidural steroid injection Palliative superior hypogastric nerve block Palliative right-sided carpal tunnel injection   Provider-requested follow-up: Return in about 3 months (around 06/02/2017) for Medication Mgmt.  No future appointments. Primary Care Physician: Christie Nottingham, PA Location: [facility] Outpatient Pain Management Facility Note by: Vevelyn Francois NP Date: 03/02/2017; Time: 10:20 AM  Pain Score Disclaimer: We use the NRS-11 scale. This is a self-reported, subjective measurement of pain severity with only modest accuracy. It is used primarily to identify changes within a particular patient. It must be understood that outpatient pain scales are significantly less accurate that those used for research, where they can be applied under ideal controlled circumstances with minimal exposure to variables. In reality, the score is likely to be a combination of pain intensity and pain affect, where pain affect describes the degree of emotional arousal or changes in action  readiness caused by the sensory experience of pain. Factors such as social and work situation, setting, emotional state, anxiety levels, expectation, and prior pain experience may influence pain perception and show large inter-individual differences that may also be affected by time variables.  Patient instructions provided during this appointment: Patient Instructions   ____________________________________________________________________________________________  Medication Rules  Applies to: All patients receiving prescriptions (written or electronic).  Pharmacy of record: Pharmacy where electronic prescriptions will be sent. If written prescriptions are taken to a different pharmacy, please inform the nursing staff. The pharmacy listed in the electronic medical record should be the one where you would like electronic prescriptions to be sent.  Prescription refills: Only during scheduled appointments. Applies to both, written and electronic prescriptions.  NOTE: The following applies primarily to controlled substances (Opioid Pain Medications)  Patient's responsibilities: 1. Pain Pills: Bring all pain pills to every appointment (except for procedure appointments). 2. Pill Bottles: Bring pills in original pharmacy bottle. Always bring newest bottle. Bring bottle, even if empty. 3. Medication refills: You are responsible for knowing and keeping track of what medications you need refilled. The day before your appointment, write a list of all prescriptions that need to be refilled. Bring that list to your appointment and give it to the admitting nurse. Prescriptions will be written only during appointments. If you forget a medication, it will not be "Called in", "Faxed", or "electronically sent". You will need to get another appointment to get these prescribed. 4. Prescription Accuracy: You are responsible for carefully inspecting your prescriptions before leaving our office. Have the discharge  nurse carefully go over each prescription with you, before taking them home. Make sure that your name is accurately spelled, that your address is correct. Check the name and dose of your medication to make sure it is accurate. Check the number of pills, and the written instructions to make sure they are clear and accurate. Make sure that you are given enough medication to last until  your next medication refill appointment. 5. Taking Medication: Take medication as prescribed. Never take more pills than instructed. Never take medication more frequently than prescribed. Taking less pills or less frequently is permitted and encouraged, when it comes to controlled substances (written prescriptions).  6. Inform other Doctors: Always inform, all of your healthcare providers, of all the medications you take. 7. Pain Medication from other Providers: You are not allowed to accept any additional pain medication from any other Doctor or Healthcare provider. There are two exceptions to this rule. (see below) In the event that you require additional pain medication, you are responsible for notifying us, as stated below. 8. Medication Agreement: You are responsible for carefully reading and following our Medication Agreement. This must be signed before receiving any prescriptions from our practice. Safely store a copy of your signed Agreement. Violations to the Agreement will result in no further prescriptions. (Additional copies of our Medication Agreement are available upon request.) 9. Laws, Rules, & Regulations: All patients are expected to follow all Federal and [employer], Rules, [employer]. Ignorance of the Laws does not constitute a valid excuse.  Exceptions: There are only two exceptions to the rule of not receiving pain medications from other Healthcare Providers. 1. Exception #1 (Emergencies): In the event of an emergency (i.e.: accident requiring emergency care), you are allowed to receive  additional pain medication. However, you are responsible for: As soon as you are able, call our office [phone], at any time of the day or night, and leave a message stating your name, the date and nature of the emergency, and the name and dose of the medication prescribed. In the event that your call is answered by a member of our staff, make sure to document and save the date, time, and the name of the person that took your information.  2. Exception #2 (Planned Surgery): In the event that you are scheduled by another doctor or dentist to have any type of surgery or procedure, you are allowed (for a period no longer than 30 days), to receive additional pain medication, for the acute post-op pain. However, in this case, you are responsible for picking up a copy of our "Post-op Pain Management for Surgeons" handout, and giving it to your surgeon or dentist. This document is available at our office, and does not require an appointment to obtain it. Simply go to our office during business hours (Monday-Thursday from 8:00 AM to 4:00 PM) (Friday 8:00 AM to 12:00 Noon) or if you have a scheduled appointment with [place], prior to your surgery, and ask for it by name. In addition, you will need to provide [place] with your name, name of your surgeon, type of surgery, and date of procedure or surgery.  ____________________________________________________________________________________________

## 2017-02-19 ENCOUNTER — Ambulatory Visit: Payer: BLUE CROSS/BLUE SHIELD | Admitting: Nurse Practitioner

## 2017-03-02 ENCOUNTER — Encounter: Payer: Self-pay | Admitting: Nurse Practitioner

## 2017-03-02 ENCOUNTER — Ambulatory Visit: Payer: BLUE CROSS/BLUE SHIELD | Attending: Nurse Practitioner | Admitting: Nurse Practitioner

## 2017-03-02 VITALS — BP 148/68 | HR 86 | Resp 16 | Ht <= 58 in | Wt 138.0 lb

## 2017-03-02 DIAGNOSIS — M25551 Pain in right hip: Secondary | ICD-10-CM | POA: Insufficient documentation

## 2017-03-02 DIAGNOSIS — I10 Essential (primary) hypertension: Secondary | ICD-10-CM | POA: Insufficient documentation

## 2017-03-02 DIAGNOSIS — M545 Low back pain: Secondary | ICD-10-CM | POA: Diagnosis not present

## 2017-03-02 DIAGNOSIS — M797 Fibromyalgia: Secondary | ICD-10-CM | POA: Insufficient documentation

## 2017-03-02 DIAGNOSIS — E559 Vitamin D deficiency, unspecified: Secondary | ICD-10-CM | POA: Diagnosis not present

## 2017-03-02 DIAGNOSIS — E663 Overweight: Secondary | ICD-10-CM | POA: Diagnosis not present

## 2017-03-02 DIAGNOSIS — T402X5A Adverse effect of other opioids, initial encounter: Secondary | ICD-10-CM

## 2017-03-02 DIAGNOSIS — E041 Nontoxic single thyroid nodule: Secondary | ICD-10-CM | POA: Insufficient documentation

## 2017-03-02 DIAGNOSIS — M160 Bilateral primary osteoarthritis of hip: Secondary | ICD-10-CM | POA: Diagnosis not present

## 2017-03-02 DIAGNOSIS — M5136 Other intervertebral disc degeneration, lumbar region: Secondary | ICD-10-CM | POA: Insufficient documentation

## 2017-03-02 DIAGNOSIS — M25561 Pain in right knee: Secondary | ICD-10-CM | POA: Insufficient documentation

## 2017-03-02 DIAGNOSIS — M79605 Pain in left leg: Secondary | ICD-10-CM | POA: Insufficient documentation

## 2017-03-02 DIAGNOSIS — M47816 Spondylosis without myelopathy or radiculopathy, lumbar region: Secondary | ICD-10-CM | POA: Diagnosis not present

## 2017-03-02 DIAGNOSIS — M48061 Spinal stenosis, lumbar region without neurogenic claudication: Secondary | ICD-10-CM | POA: Diagnosis not present

## 2017-03-02 DIAGNOSIS — Z6825 Body mass index (BMI) 25.0-25.9, adult: Secondary | ICD-10-CM | POA: Insufficient documentation

## 2017-03-02 DIAGNOSIS — Z79891 Long term (current) use of opiate analgesic: Secondary | ICD-10-CM | POA: Insufficient documentation

## 2017-03-02 DIAGNOSIS — F17210 Nicotine dependence, cigarettes, uncomplicated: Secondary | ICD-10-CM | POA: Diagnosis not present

## 2017-03-02 DIAGNOSIS — G894 Chronic pain syndrome: Secondary | ICD-10-CM | POA: Insufficient documentation

## 2017-03-02 DIAGNOSIS — G8929 Other chronic pain: Secondary | ICD-10-CM | POA: Insufficient documentation

## 2017-03-02 DIAGNOSIS — R109 Unspecified abdominal pain: Secondary | ICD-10-CM | POA: Diagnosis present

## 2017-03-02 DIAGNOSIS — M79604 Pain in right leg: Secondary | ICD-10-CM | POA: Insufficient documentation

## 2017-03-02 DIAGNOSIS — F329 Major depressive disorder, single episode, unspecified: Secondary | ICD-10-CM | POA: Diagnosis not present

## 2017-03-02 DIAGNOSIS — K5903 Drug induced constipation: Secondary | ICD-10-CM | POA: Diagnosis not present

## 2017-03-02 DIAGNOSIS — G5601 Carpal tunnel syndrome, right upper limb: Secondary | ICD-10-CM | POA: Insufficient documentation

## 2017-03-02 DIAGNOSIS — M549 Dorsalgia, unspecified: Secondary | ICD-10-CM | POA: Diagnosis present

## 2017-03-02 DIAGNOSIS — R1013 Epigastric pain: Secondary | ICD-10-CM | POA: Insufficient documentation

## 2017-03-02 DIAGNOSIS — E039 Hypothyroidism, unspecified: Secondary | ICD-10-CM | POA: Insufficient documentation

## 2017-03-02 DIAGNOSIS — D1800 Hemangioma unspecified site: Secondary | ICD-10-CM | POA: Insufficient documentation

## 2017-03-02 DIAGNOSIS — J449 Chronic obstructive pulmonary disease, unspecified: Secondary | ICD-10-CM | POA: Insufficient documentation

## 2017-03-02 MED ORDER — POLYETHYLENE GLYCOL 3350 17 GM/SCOOP PO POWD: ORAL | 0 refills | Status: AC

## 2017-03-02 MED ORDER — OXYCODONE-ACETAMINOPHEN 10-325 MG PO TABS: 1.0000 | ORAL_TABLET | Freq: Three times a day (TID) | ORAL | 0 refills | Status: DC | PRN

## 2017-03-02 NOTE — Progress Notes (Signed)
Nursing Pain Medication Assessment:  Safety precautions to be maintained throughout the outpatient stay will include: orient to surroundings, keep bed in low position, maintain call bell within reach at all times, provide assistance with transfer out of bed and ambulation.  Medication Inspection Compliance: Pill count conducted under aseptic conditions, in front of the patient. Neither the pills nor the bottle was removed from the patient's sight at any time. Once count was completed pills were immediately returned to the patient in their original bottle.  Medication: See above Pill/Patch Count: 0 of 90 pills remain Pill/Patch Appearance: Markings consistent with prescribed medication Bottle Appearance: Standard pharmacy container. Clearly labeled. Filled Date: 5 / 04 / 2018 Last Medication intake:  Today

## 2017-03-02 NOTE — Patient Instructions (Signed)

## 2017-06-02 ENCOUNTER — Ambulatory Visit: Payer: BLUE CROSS/BLUE SHIELD | Attending: Nurse Practitioner | Admitting: Nurse Practitioner

## 2017-06-02 ENCOUNTER — Encounter: Payer: Self-pay | Admitting: Nurse Practitioner

## 2017-06-02 VITALS — BP 148/85 | HR 89 | Temp 97.6°F | Resp 16 | Ht 58.5 in | Wt 142.0 lb

## 2017-06-02 DIAGNOSIS — Z5181 Encounter for therapeutic drug level monitoring: Secondary | ICD-10-CM | POA: Diagnosis not present

## 2017-06-02 DIAGNOSIS — E663 Overweight: Secondary | ICD-10-CM | POA: Diagnosis not present

## 2017-06-02 DIAGNOSIS — I10 Essential (primary) hypertension: Secondary | ICD-10-CM | POA: Diagnosis not present

## 2017-06-02 DIAGNOSIS — G894 Chronic pain syndrome: Secondary | ICD-10-CM | POA: Insufficient documentation

## 2017-06-02 DIAGNOSIS — M1711 Unilateral primary osteoarthritis, right knee: Secondary | ICD-10-CM | POA: Diagnosis not present

## 2017-06-02 DIAGNOSIS — M47816 Spondylosis without myelopathy or radiculopathy, lumbar region: Secondary | ICD-10-CM | POA: Diagnosis not present

## 2017-06-02 DIAGNOSIS — Z79899 Other long term (current) drug therapy: Secondary | ICD-10-CM | POA: Insufficient documentation

## 2017-06-02 DIAGNOSIS — M48061 Spinal stenosis, lumbar region without neurogenic claudication: Secondary | ICD-10-CM | POA: Diagnosis not present

## 2017-06-02 DIAGNOSIS — R102 Pelvic and perineal pain: Secondary | ICD-10-CM | POA: Diagnosis not present

## 2017-06-02 DIAGNOSIS — R1084 Generalized abdominal pain: Secondary | ICD-10-CM | POA: Insufficient documentation

## 2017-06-02 DIAGNOSIS — M47896 Other spondylosis, lumbar region: Secondary | ICD-10-CM | POA: Diagnosis not present

## 2017-06-02 DIAGNOSIS — K5900 Constipation, unspecified: Secondary | ICD-10-CM | POA: Diagnosis not present

## 2017-06-02 DIAGNOSIS — M1610 Unilateral primary osteoarthritis, unspecified hip: Secondary | ICD-10-CM | POA: Insufficient documentation

## 2017-06-02 DIAGNOSIS — E039 Hypothyroidism, unspecified: Secondary | ICD-10-CM | POA: Diagnosis not present

## 2017-06-02 DIAGNOSIS — E119 Type 2 diabetes mellitus without complications: Secondary | ICD-10-CM | POA: Diagnosis not present

## 2017-06-02 DIAGNOSIS — J449 Chronic obstructive pulmonary disease, unspecified: Secondary | ICD-10-CM | POA: Insufficient documentation

## 2017-06-02 DIAGNOSIS — M4696 Unspecified inflammatory spondylopathy, lumbar region: Secondary | ICD-10-CM

## 2017-06-02 DIAGNOSIS — Z6825 Body mass index (BMI) 25.0-25.9, adult: Secondary | ICD-10-CM | POA: Diagnosis not present

## 2017-06-02 DIAGNOSIS — G629 Polyneuropathy, unspecified: Secondary | ICD-10-CM | POA: Insufficient documentation

## 2017-06-02 DIAGNOSIS — E041 Nontoxic single thyroid nodule: Secondary | ICD-10-CM | POA: Diagnosis not present

## 2017-06-02 DIAGNOSIS — E559 Vitamin D deficiency, unspecified: Secondary | ICD-10-CM | POA: Insufficient documentation

## 2017-06-02 DIAGNOSIS — F17210 Nicotine dependence, cigarettes, uncomplicated: Secondary | ICD-10-CM | POA: Diagnosis not present

## 2017-06-02 DIAGNOSIS — G5601 Carpal tunnel syndrome, right upper limb: Secondary | ICD-10-CM | POA: Diagnosis not present

## 2017-06-02 DIAGNOSIS — M545 Low back pain: Secondary | ICD-10-CM | POA: Diagnosis not present

## 2017-06-02 DIAGNOSIS — M797 Fibromyalgia: Secondary | ICD-10-CM | POA: Diagnosis not present

## 2017-06-02 DIAGNOSIS — F329 Major depressive disorder, single episode, unspecified: Secondary | ICD-10-CM | POA: Insufficient documentation

## 2017-06-02 DIAGNOSIS — Z79891 Long term (current) use of opiate analgesic: Secondary | ICD-10-CM | POA: Insufficient documentation

## 2017-06-02 DIAGNOSIS — M25561 Pain in right knee: Secondary | ICD-10-CM | POA: Insufficient documentation

## 2017-06-02 DIAGNOSIS — G8929 Other chronic pain: Secondary | ICD-10-CM

## 2017-06-02 MED ORDER — OXYCODONE-ACETAMINOPHEN 10-325 MG PO TABS: 1.0000 | ORAL_TABLET | Freq: Three times a day (TID) | ORAL | 0 refills | Status: DC | PRN

## 2017-06-02 NOTE — Patient Instructions (Addendum)
____________________________________________________________________________________________  Medication Rules  Applies to: All patients receiving prescriptions (written or electronic).  Pharmacy of record: Pharmacy where electronic prescriptions will be sent. If written prescriptions are taken to a different pharmacy, please inform the nursing staff. The pharmacy listed in the electronic medical record should be the one where you would like electronic prescriptions to be sent.  Prescription refills: Only during scheduled appointments. Applies to both, written and electronic prescriptions.  NOTE: The following applies primarily to controlled substances (Opioid* Pain Medications).   Patient's responsibilities: 1. Pain Pills: Bring all pain pills to every appointment (except for procedure appointments). 2. Pill Bottles: Bring pills in original pharmacy bottle. Always bring newest bottle. Bring bottle, even if empty. 3. Medication refills: You are responsible for knowing and keeping track of what medications you need refilled. The day before your appointment, write a list of all prescriptions that need to be refilled. Bring that list to your appointment and give it to the admitting nurse. Prescriptions will be written only during appointments. If you forget a medication, it will not be "Called in", "Faxed", or "electronically sent". You will need to get another appointment to get these prescribed. 4. Prescription Accuracy: You are responsible for carefully inspecting your prescriptions before leaving our office. Have the discharge nurse carefully go over each prescription with you, before taking them home. Make sure that your name is accurately spelled, that your address is correct. Check the name and dose of your medication to make sure it is accurate. Check the number of pills, and the written instructions to make sure they are clear and accurate. Make sure that you are given enough medication to  last until your next medication refill appointment. 5. Taking Medication: Take medication as prescribed. Never take more pills than instructed. Never take medication more frequently than prescribed. Taking less pills or less frequently is permitted and encouraged, when it comes to controlled substances (written prescriptions).  6. Inform other Doctors: Always inform, all of your healthcare providers, of all the medications you take. 7. Pain Medication from other Providers: You are not allowed to accept any additional pain medication from any other Doctor or Healthcare provider. There are two exceptions to this rule. (see below) In the event that you require additional pain medication, you are responsible for notifying us, as stated below. 8. Medication Agreement: You are responsible for carefully reading and following our Medication Agreement. This must be signed before receiving any prescriptions from our practice. Safely store a copy of your signed Agreement. Violations to the Agreement will result in no further prescriptions. (Additional copies of our Medication Agreement are available upon request.) 9. Laws, Rules, & Regulations: All patients are expected to follow all Federal and State Laws, Statutes, Rules, & Regulations. Ignorance of the Laws does not constitute a valid excuse. The use of any illegal substances is prohibited. 10. Adopted CDC guidelines & recommendations: Target dosing levels will be at or below 60 MME/day. Use of benzodiazepines** is not recommended.  Exceptions: There are only two exceptions to the rule of not receiving pain medications from other Healthcare Providers. 1. Exception #1 (Emergencies): In the event of an emergency (i.e.: accident requiring emergency care), you are allowed to receive additional pain medication. However, you are responsible for: As soon as you are able, call our office [phone], at any time of the day or night, and leave a message stating your  name, the date and nature of the emergency, and the name and dose of the medication   prescribed. In the event that your call is answered by a member of our staff, make sure to document and save the date, time, and the name of the person that took your information.  2. Exception #2 (Planned Surgery): In the event that you are scheduled by another doctor or dentist to have any type of surgery or procedure, you are allowed (for a period no longer than 30 days), to receive additional pain medication, for the acute post-op pain. However, in this case, you are responsible for picking up a copy of our "Post-op Pain Management for Surgeons" handout, and giving it to your surgeon or dentist. This document is available at our office, and does not require an appointment to obtain it. Simply go to our office during business hours (Monday-Thursday from 8:00 AM to 4:00 PM) (Friday 8:00 AM to 12:00 Noon) or if you have a scheduled appointment with [place], prior to your surgery, and ask for it by name. In addition, you will need to provide [place] with your name, name of your surgeon, type of surgery, and date of procedure or surgery.  *Opioid medications include: morphine, codeine, oxycodone, oxymorphone, hydrocodone, hydromorphone, meperidine, tramadol, tapentadol, buprenorphine, fentanyl, methadone. **Benzodiazepine medications include: diazepam (Valium), alprazolam (Xanax), clonazepam (Klonopine), lorazepam (Ativan), clorazepate (Tranxene), chlordiazepoxide (Librium), estazolam (Prosom), oxazepam (Serax), temazepam (Restoril), triazolam (Halcion)  Oxycodone 10/325 x 3 prescriptions  ____________________________________________________________________________________________ Estimated body mass index is 29.17 kg/m as calculated from the following:   Height as of this encounter: 4' 10.5" (1.486 m).   Weight as of this encounter: 142 lb (64.4 kg).

## 2017-06-02 NOTE — Progress Notes (Signed)
Nursing Pain Medication Assessment:  Safety precautions to be maintained throughout the outpatient stay will include: orient to surroundings, keep bed in low position, maintain call bell within reach at all times, provide assistance with transfer out of bed and ambulation.  Medication Inspection Compliance: Pill count conducted under aseptic conditions, in front of the patient. Neither the pills nor the bottle was removed from the patient's sight at any time. Once count was completed pills were immediately returned to the patient in their original bottle.  Medication: See above Pill/Patch Count: 0 of 90 pills remain Pill/Patch Appearance: Markings consistent with prescribed medication Bottle Appearance: Standard pharmacy container. Clearly labeled. Filled Date: 8 / 2 / 2018 Last Medication intake:  Today

## 2017-06-02 NOTE — Progress Notes (Signed)
Patient's Name: Vicki Fox  MRN: [id number]  Referring Provider: Christie Nottingham, [place]  DOB: 1965-11-22  PCP: Christie Nottingham, [place]  DOS: 06/02/2017  Note by: Vevelyn Francois NP  Service setting: Ambulatory outpatient  Specialty: Interventional Pain Management  Location: [facility] (AMB) Pain Management Facility    Patient type: Established    Primary Reason(s) for Visit: Encounter for prescription drug management. (Level of risk: moderate)  CC: Back Pain (lower) and Abdominal Pain  HPI  Vicki Fox is a 51 y.o. year old, female patient, who comes today for a medication management evaluation. She has Fibromyalgia; Essential hypertension; Hypothyroidism; Vitamin D deficiency; Chronic abdominal pain (Location of Secondary source of pain) (Epigastric); CN (constipation); Overweight (BMI 25.0-29.9); FH: diabetes mellitus; Long term current use of opiate analgesic; Long term prescription opiate use; Opiate use (45 MME/Day); Encounter for therapeutic drug level monitoring; Encounter for chronic pain management; Depression; Gallbladder disease; Adhesions Pelvic; Generalized abdominal pain (Location of Secondary source of pain) (Epigastric); Adhesions Abdominal; Chronic pelvic pain in female; Chronic low back pain (Location of Primary Source of Pain) (Bilateral) (L>R); Lumbar spondylosis; Chronic lower extremity pain (Bilateral) (L>R); Chronic radicular pain of lower extremity(2) (B) (S1) (L>R); Osteoarthritis of hip (Location of Tertiary source of pain) (Bilateral) (L>R); Chronic hip pain (Location of Tertiary source of pain) (Bilateral) (R>L); CRP elevated; Thyroid nodule; COPD with emphysema ([place]); Nicotine dependence; Hypomagnesemia; Tobacco abuse; Neuropathy; Neurogenic pain; Neuropathic pain; Right carpal tunnel syndrome; Lumbar facet syndrome (Location of Primary Source of Pain) (Bilateral) (L>R); Opioid-induced constipation (OIC); Mechanical knee pain, right; Chronic pain syndrome; Chronic knee pain (Right);  Osteoarthritis of knee (Right); and Lumbar stenosis without neurogenic claudication on her problem list. Her primarily concern today is the Back Pain (lower) and Abdominal Pain  Pain Assessment: Location: Right, Left, Lower Back Radiating: hips/buttocks Onset: More than a month ago Duration: Chronic pain Quality: Constant, Discomfort, Stabbing, Aching Severity: 4 /10 (self-reported pain score)  Note: Reported level is compatible with observation.                   Effect on ADL: sitting to long hurts hips, pace self, squatting Timing: Constant Modifying factors: medications  Vicki Fox was last scheduled for an appointment on 03/02/2017 for medication management. During today's appointment we reviewed [name]'s chronic pain status, as well as her outpatient medication regimen.  The patient  reports that she does not use drugs. Her body mass index is 29.17 kg/m.  Further details on both, my assessment(s), as well as the proposed treatment plan, please see below.  Controlled Substance Pharmacotherapy Assessment REMS (Risk Evaluation and Mitigation Strategy)  Analgesic:Oxycodone/APAP 10/325 one tablet every 8 hours when necessary (30 mg/day) MME/day:45 mg/day Ignatius Specking, RN  06/02/2017  9:15 AM  Sign at close encounter Nursing Pain Medication Assessment:  Safety precautions to be maintained throughout the outpatient stay will include: orient to surroundings, keep bed in low position, maintain call bell within reach at all times, provide assistance with transfer out of bed and ambulation.  Medication Inspection Compliance: Pill count conducted under aseptic conditions, in front of the patient. Neither the pills nor the bottle was removed from the patient's sight at any time. Once count was completed pills were immediately returned to the patient in their original bottle.  Medication: See above Pill/Patch Count: 0 of 90 pills remain Pill/Patch Appearance: Markings consistent with  prescribed medication Bottle Appearance: Standard pharmacy container. Clearly labeled. Filled Date: 8 / 2 / 2018 Last  Medication intake:  Today   Pharmacokinetics: Liberation and absorption (onset of action): WNL Distribution (time to peak effect): WNL Metabolism and excretion (duration of action): WNL         Pharmacodynamics: Desired effects: Analgesia: Vicki Fox reports >50% benefit. Functional ability: Patient reports that medication allows her to accomplish basic ADLs Clinically meaningful improvement in function (CMIF): Sustained CMIF goals met Perceived effectiveness: Described as relatively effective, allowing for increase in activities of daily living (ADL) Undesirable effects: Side-effects or Adverse reactions: None reported Monitoring: [place] PMP: Online review of the past 48-monthperiod conducted. Compliant with practice rules and regulations List of all UDS test(s) done:  Lab Results  Component Value Date   TOXASSSELUR FINAL 03/05/2016   TOXASSSELUR FINAL 12/06/2015   TOXASSSELUR FINAL 09/05/2015   Last UDS on record: ToxAssure Select 13  Date Value Ref Range Status  03/05/2016 FINAL  Final    Comment:    ==================================================================== TOXASSURE SELECT 13 (MW) ==================================================================== Test                             Result       Flag       Units Drug Present and Declared for Prescription Verification   Oxycodone                      2828         EXPECTED   ng/mg creat   Oxymorphone                    5114         EXPECTED   ng/mg creat   Noroxycodone                   7706         EXPECTED   ng/mg creat   Noroxymorphone                 919          EXPECTED   ng/mg creat    Sources of oxycodone are scheduled prescription medications.    Oxymorphone, noroxycodone, and noroxymorphone are expected    metabolites of oxycodone. Oxymorphone is also available as a    scheduled  prescription medication. ==================================================================== Test                      Result    Flag   Units      Ref Range   Creatinine              97               mg/dL      >=20 ==================================================================== Declared Medications:  The flagging and interpretation on this report are based on the  following declared medications.  Unexpected results may arise from  inaccuracies in the declared medications.  **Note: The testing scope of this panel includes these medications:  Oxycodone (Percocet)  **Note: The testing scope of this panel does not include following  reported medications:  Acetaminophen (Percocet)  Amitriptyline (Elavil)  Fluticasone (Flonase)  Hydrochlorothiazide  Ibuprofen  Levothyroxine  Magnesium Oxide  Polyethylene Glycol  Vitamin D2 (Drisdol)  Vitamin D3 ==================================================================== For clinical consultation, please call ([phone] ====================================================================    UDS interpretation: Compliant          Medication Assessment Form: Reviewed. Patient indicates being compliant with therapy Treatment compliance: Compliant  Risk Assessment Profile: Aberrant behavior: See prior evaluations. None observed or detected today Comorbid factors increasing risk of overdose: See prior notes. No additional risks detected today Risk of substance use disorder (SUD): Low     Opioid Risk Tool - 06/02/17 0912      Family History of Substance Abuse   Alcohol Negative   Rx Drugs Negative     Personal History of Substance Abuse   Alcohol Negative   Illegal Drugs Negative   Rx Drugs Negative     Psychological Disease   Psychological Disease Positive   ADD Positive  started on adderal and stopped had trouble concentrating- did not take long   OCD Negative   Bipolar Negative   Schizophrenia Negative   Depression  Negative     Total Score   Opioid Risk Tool Scoring 2   Opioid Risk Interpretation Low Risk     ORT Scoring interpretation table:  Score <3 = Low Risk for SUD  Score between 4-7 = Moderate Risk for SUD  Score >8 = High Risk for Opioid Abuse   Risk Mitigation Strategies:  Patient Counseling: Covered Patient-Prescriber Agreement (PPA): Present and active  Notification to other healthcare providers: Done  Pharmacologic Plan: No change in therapy, at this time  Laboratory Chemistry  Inflammation Markers (CRP: Acute Phase) (ESR: Chronic Phase) Lab Results  Component Value Date   CRP <0.5 12/06/2015   ESRSEDRATE 3 12/06/2015                 Renal Function Markers Lab Results  Component Value Date   BUN 13 12/06/2015   CREATININE 0.75 12/06/2015   GFRAA >60 12/06/2015   GFRNONAA >60 12/06/2015                 Hepatic Function Markers Lab Results  Component Value Date   AST 19 12/06/2015   ALT 17 12/06/2015   ALBUMIN 4.6 12/06/2015   ALKPHOS 74 12/06/2015                 Electrolytes Lab Results  Component Value Date   NA 138 12/06/2015   K 3.7 12/06/2015   CL 101 12/06/2015   CALCIUM 9.6 12/06/2015   MG 1.6 (L) 12/06/2015                 Neuropathy Markers Lab Results  Component Value Date   VITAMINB12 306 12/06/2015                 Bone Pathology Markers Lab Results  Component Value Date   ALKPHOS 74 12/06/2015   VD25OH 20.9 (L) 12/06/2015   VD125OH2TOT 28.6 12/06/2015   CALCIUM 9.6 12/06/2015                 Coagulation Parameters Lab Results  Component Value Date   PLT 192 05/17/2013                 Cardiovascular Markers Lab Results  Component Value Date   HGB 12.4 05/17/2013   HCT 35.7 05/17/2013                 Note: Lab results reviewed.  Recent Diagnostic Imaging Review  Mr Lumbar Spine Wo Contrast  Result Date: 01/16/2017 CLINICAL DATA:  Chronic low back pain without sciatica EXAM: MRI LUMBAR SPINE WITHOUT CONTRAST TECHNIQUE:  Multiplanar, multisequence MR imaging of the lumbar spine was performed. No intravenous contrast was administered. COMPARISON:  None. FINDINGS: Segmentation:  Normal Alignment:  Normal Vertebrae:  Negative for fracture or mass. Hemangioma L1 vertebral body. Normal bone marrow. Conus medullaris: Extends to the L1-2 level and appears normal. Paraspinal and other soft tissues: Paraspinous muscles are well developed and symmetric. No retroperitoneal abnormality. Disc levels: L1-2:  Mild disc degeneration L2-3:  Negative L3-4:  Mild disc and mild facet degeneration without stenosis L4-5: Disc degeneration with mild diffuse disc bulging. Moderate facet hypertrophy. Mild spinal stenosis. Mild subarticular stenosis on the left. L5-S1:  Mild facet degeneration without stenosis. IMPRESSION: Mild spinal stenosis L4-5 with subarticular stenosis on the left. No focal disc protrusion. Electronically Signed   By: Franchot Gallo M.D.   On: 01/16/2017 08:41   Note: Imaging results reviewed.          Meds   Current Outpatient Prescriptions:  .  Cholecalciferol (VITAMIN D3) 2000 units capsule, Take 1 capsule (2,000 Units total) by mouth daily., Disp: 30 capsule, Rfl: PRN .  fluticasone (FLONASE) 50 MCG/ACT nasal spray, instill 1 to 2 sprays into each nostril once daily, Disp: , Rfl: 0 .  hydrochlorothiazide (HYDRODIURIL) 25 MG tablet, Take 1 tablet by mouth daily at 2 PM daily at 2 PM., Disp: , Rfl: 0 .  ibuprofen (ADVIL,MOTRIN) 200 MG tablet, Take 800 mg by mouth every 8 (eight) hours as needed., Disp: , Rfl:  .  levothyroxine (SYNTHROID, LEVOTHROID) 100 MCG tablet, Take 100 mcg by mouth daily before breakfast., Disp: , Rfl:  .  polyethylene glycol powder (GLYCOLAX/MIRALAX) powder, take 17GM (DISSOLVED IN WATER) by mouth once daily, Disp: 255 g, Rfl: 0 .  PROAIR HFA 108 (90 Base) MCG/ACT inhaler, 90 mcg., Disp: , Rfl: 0 .  oxyCODONE-acetaminophen (PERCOCET) 10-325 MG tablet, Take 1 tablet by mouth every 8 (eight) hours  as needed for pain., Disp: 90 tablet, Rfl: 0 .  [START ON 07/02/2017] oxyCODONE-acetaminophen (PERCOCET) 10-325 MG tablet, Take 1 tablet by mouth every 8 (eight) hours as needed for pain., Disp: 90 tablet, Rfl: 0 .  [START ON 08/01/2017] oxyCODONE-acetaminophen (PERCOCET) 10-325 MG tablet, Take 1 tablet by mouth every 8 (eight) hours as needed for pain., Disp: 90 tablet, Rfl: 0  ROS  Constitutional: Denies any fever or chills Gastrointestinal: No reported hemesis, hematochezia, vomiting, or acute GI distress Musculoskeletal: Denies any acute onset joint swelling, redness, loss of ROM, or weakness Neurological: No reported episodes of acute onset apraxia, aphasia, dysarthria, agnosia, amnesia, paralysis, loss of coordination, or loss of consciousness  Allergies  Ms. Occhipinti has No Known Allergies.  [place]  Drug: Ms. Schatzman  reports that she does not use drugs. Alcohol:  reports that she drinks alcohol. Tobacco:  reports that she has been smoking Cigarettes.  She has a 30.00 pack-year smoking history. She has never used smokeless tobacco. Medical:  has a past medical history of Arthritis; Asthma; Flu; and Respiratory abnormalities. Surgical: Ms. Williard  has a past surgical history that includes Cesarean section; Cholecystectomy; and Tubal ligation. Family: family history includes Diabetes in her father and mother; Heart disease in her father.  Constitutional Exam  General appearance: Well nourished, well developed, and well hydrated. In no apparent acute distress Vitals:   06/02/17 0905  BP: (!) 148/85  Pulse: 89  Resp: 16  Temp: 97.6 F (36.4 C)  SpO2: 100%  Weight: 142 lb (64.4 kg)  Height: 4' 10.5" (1.486 m)   BMI Assessment: Estimated body mass index is 29.17 kg/m as calculated from the following:   Height as of this encounter: 4' 10.5" (1.486 m).   Weight as of  this encounter: 142 lb (64.4 kg). Psych/Mental status: Alert, oriented x 3 (person, place, & time)       Eyes:  PERLA Respiratory: No evidence of acute respiratory distress  Cervical Spine Area Exam  Skin & Axial Inspection: No masses, redness, edema, swelling, or associated skin lesions Alignment: Symmetrical Functional ROM: Unrestricted ROM      Stability: No instability detected Muscle Tone/Strength: Functionally intact. No obvious neuro-muscular anomalies detected. Sensory (Neurological): Unimpaired Palpation: No palpable anomalies              Upper Extremity (UE) Exam    Side: Right upper extremity  Side: Left upper extremity  Skin & Extremity Inspection: Skin color, temperature, and hair growth are WNL. No peripheral edema or cyanosis. No masses, redness, swelling, asymmetry, or associated skin lesions. No contractures.  Skin & Extremity Inspection: Skin color, temperature, and hair growth are WNL. No peripheral edema or cyanosis. No masses, redness, swelling, asymmetry, or associated skin lesions. No contractures.  Functional ROM: Unrestricted ROM          Functional ROM: Unrestricted ROM          Muscle Tone/Strength: Functionally intact. No obvious neuro-muscular anomalies detected.  Muscle Tone/Strength: Functionally intact. No obvious neuro-muscular anomalies detected.  Sensory (Neurological): Unimpaired          Sensory (Neurological): Unimpaired          Palpation: No palpable anomalies              Palpation: No palpable anomalies              Specialized Test(s): Deferred         Specialized Test(s): Deferred          Thoracic Spine Area Exam  Skin & Axial Inspection: No masses, redness, or swelling Alignment: Symmetrical Functional ROM: Unrestricted ROM Stability: No instability detected Muscle Tone/Strength: Functionally intact. No obvious neuro-muscular anomalies detected. Sensory (Neurological): Unimpaired Muscle strength & Tone: No palpable anomalies  Lumbar Spine Area Exam  Skin & Axial Inspection: No masses, redness, or swelling Alignment: Symmetrical Functional ROM:  Unrestricted ROM      Stability: No instability detected Muscle Tone/Strength: Functionally intact. No obvious neuro-muscular anomalies detected. Sensory (Neurological): Unimpaired Palpation: Complains of area being tender to palpation       Provocative Tests: Lumbar Hyperextension and rotation test: Positive bilaterally for facet joint pain. Lumbar Lateral bending test: evaluation deferred today       Patrick's Maneuver: evaluation deferred today                    Gait & Posture Assessment  Ambulation: Unassisted Gait: Relatively normal for age and body habitus Posture: WNL   Lower Extremity Exam    Side: Right lower extremity  Side: Left lower extremity  Skin & Extremity Inspection: Skin color, temperature, and hair growth are WNL. No peripheral edema or cyanosis. No masses, redness, swelling, asymmetry, or associated skin lesions. No contractures.  Skin & Extremity Inspection: Skin color, temperature, and hair growth are WNL. No peripheral edema or cyanosis. No masses, redness, swelling, asymmetry, or associated skin lesions. No contractures.  Functional ROM: Unrestricted ROM          Functional ROM: Unrestricted ROM          Muscle Tone/Strength: Functionally intact. No obvious neuro-muscular anomalies detected.  Muscle Tone/Strength: Functionally intact. No obvious neuro-muscular anomalies detected.  Sensory (Neurological): Unimpaired  Sensory (Neurological): Unimpaired  Palpation: No palpable anomalies  Palpation:  No palpable anomalies   Assessment  Primary Diagnosis & Pertinent Problem List: The primary encounter diagnosis was Chronic low back pain (Location of Primary Source of Pain) (Bilateral) (L>R). Diagnoses of Lumbar facet syndrome (Location of Primary Source of Pain) (Bilateral) (L>R), Lumbar spondylosis, Chronic pain syndrome, and Long term current use of opiate analgesic were also pertinent to this visit.  Status Diagnosis  Controlled Controlled Controlled 1. Chronic  low back pain (Location of Primary Source of Pain) (Bilateral) (L>R)   2. Lumbar facet syndrome (Location of Primary Source of Pain) (Bilateral) (L>R)   3. Lumbar spondylosis   4. Chronic pain syndrome   5. Long term current use of opiate analgesic     Problems updated and reviewed during this visit: No problems updated. Plan of Care  Pharmacotherapy (Medications Ordered): Meds ordered this encounter  Medications  . oxyCODONE-acetaminophen (PERCOCET) 10-325 MG tablet    Sig: Take 1 tablet by mouth every 8 (eight) hours as needed for pain.    Dispense:  90 tablet    Refill:  0    Do not place this medication, or any other prescription from our practice, on "Automatic Refill". Patient may have prescription filled one day early if pharmacy is closed on scheduled refill date. Do not fill until:06/02/2017 To last until: 07/02/2017    Order Specific Question:   Supervising Provider    Answer:   Milinda Pointer [phone]  . oxyCODONE-acetaminophen (PERCOCET) 10-325 MG tablet    Sig: Take 1 tablet by mouth every 8 (eight) hours as needed for pain.    Dispense:  90 tablet    Refill:  0    Do not place this medication, or any other prescription from our practice, on "Automatic Refill". Patient may have prescription filled one day early if pharmacy is closed on scheduled refill date. Do not fill until:07/02/2017 To last until: 08/01/2017    Order Specific Question:   Supervising Provider    Answer:   Milinda Pointer [phone]  . oxyCODONE-acetaminophen (PERCOCET) 10-325 MG tablet    Sig: Take 1 tablet by mouth every 8 (eight) hours as needed for pain.    Dispense:  90 tablet    Refill:  0    Do not place this medication, or any other prescription from our practice, on "Automatic Refill". Patient may have prescription filled one day early if pharmacy is closed on scheduled refill date. Do not fill until: 08/01/2017 To last until: 08/31/2017    Order Specific Question:   Supervising Provider     Answer:   Milinda Pointer [phone]   New Prescriptions   No medications on file   Medications administered today: Ms. Shimabukuro had no medications administered during this visit. Lab-work, procedure(s), and/or referral(s): Orders Placed This Encounter  Procedures  . ToxASSURE Select 13 (MW), Urine   Imaging and/or referral(s): None  Interventional therapies: Planned, scheduled, and/or pending:   Right-sided hip x-ray Continue with current regimen    Considering:   Diagnostic, bilateral celiac plexus block Diagnostic bilateral intra-articular hip joint injection Diagnostic bilateral lumbar facet block Possible bilateral lumbar facet radiofrequencyablation. Diagnostic left sided lumbar epidural steroid injection Diagnostic superior hypogastric nerve block Diagnostic right-sided carpal tunnel injection   Palliative PRN treatment(s):   Palliative, bilateral celiac plexus block Palliative bilateral intra-articular hip joint injection Palliative bilateral lumbar facet block Palliative left sided lumbar epidural steroid injection Palliative superior hypogastric nerve block Palliative right-sided carpal tunnel injection   Provider-requested follow-up: Return in about 3 months (around 09/01/2017) for  MedMgmt.  Future Appointments Date Time Provider [place]  09/01/2017 9:15 AM Vevelyn Francois, NP [facility] None   Primary Care Physician: Christie Nottingham, PA Location: [facility] Outpatient Pain Management Facility Note by: Vevelyn Francois NP Date: 06/02/2017; Time: 3:23 PM  Pain Score Disclaimer: We use the NRS-11 scale. This is a self-reported, subjective measurement of pain severity with only modest accuracy. It is used primarily to identify changes within a particular patient. It must be understood that outpatient pain scales are significantly less accurate that those used for research, where they can be applied under ideal controlled circumstances with minimal  exposure to variables. In reality, the score is likely to be a combination of pain intensity and pain affect, where pain affect describes the degree of emotional arousal or changes in action readiness caused by the sensory experience of pain. Factors such as social and work situation, setting, emotional state, anxiety levels, expectation, and prior pain experience may influence pain perception and show large inter-individual differences that may also be affected by time variables.  Patient instructions provided during this appointment: Patient Instructions   ____________________________________________________________________________________________  Medication Rules  Applies to: All patients receiving prescriptions (written or electronic).  Pharmacy of record: Pharmacy where electronic prescriptions will be sent. If written prescriptions are taken to a different pharmacy, please inform the nursing staff. The pharmacy listed in the electronic medical record should be the one where you would like electronic prescriptions to be sent.  Prescription refills: Only during scheduled appointments. Applies to both, written and electronic prescriptions.  NOTE: The following applies primarily to controlled substances (Opioid* Pain Medications).   Patient's responsibilities: 1. Pain Pills: Bring all pain pills to every appointment (except for procedure appointments). 2. Pill Bottles: Bring pills in original pharmacy bottle. Always bring newest bottle. Bring bottle, even if empty. 3. Medication refills: You are responsible for knowing and keeping track of what medications you need refilled. The day before your appointment, write a list of all prescriptions that need to be refilled. Bring that list to your appointment and give it to the admitting nurse. Prescriptions will be written only during appointments. If you forget a medication, it will not be "Called in", "Faxed", or "electronically sent". You will  need to get another appointment to get these prescribed. 4. Prescription Accuracy: You are responsible for carefully inspecting your prescriptions before leaving our office. Have the discharge nurse carefully go over each prescription with you, before taking them home. Make sure that your name is accurately spelled, that your address is correct. Check the name and dose of your medication to make sure it is accurate. Check the number of pills, and the written instructions to make sure they are clear and accurate. Make sure that you are given enough medication to last until your next medication refill appointment. 5. Taking Medication: Take medication as prescribed. Never take more pills than instructed. Never take medication more frequently than prescribed. Taking less pills or less frequently is permitted and encouraged, when it comes to controlled substances (written prescriptions).  6. Inform other Doctors: Always inform, all of your healthcare providers, of all the medications you take. 7. Pain Medication from other Providers: You are not allowed to accept any additional pain medication from any other Doctor or Healthcare provider. There are two exceptions to this rule. (see below) In the event that you require additional pain medication, you are responsible for notifying us, as stated below. 8. Medication Agreement: You are responsible for carefully reading and  following our Medication Agreement. This must be signed before receiving any prescriptions from our practice. Safely store a copy of your signed Agreement. Violations to the Agreement will result in no further prescriptions. (Additional copies of our Medication Agreement are available upon request.) 9. Laws, Rules, & Regulations: All patients are expected to follow all Federal and [employer], [employer], Rules, [employer]. Ignorance of the Laws does not constitute a valid excuse. The use of any illegal substances is prohibited. 10. Adopted CDC  guidelines & recommendations: Target dosing levels will be at or below 60 MME/day. Use of benzodiazepines** is not recommended.  Exceptions: There are only two exceptions to the rule of not receiving pain medications from other Healthcare Providers. 1. Exception #1 (Emergencies): In the event of an emergency (i.e.: accident requiring emergency care), you are allowed to receive additional pain medication. However, you are responsible for: As soon as you are able, call our office [phone], at any time of the day or night, and leave a message stating your name, the date and nature of the emergency, and the name and dose of the medication prescribed. In the event that your call is answered by a member of our staff, make sure to document and save the date, time, and the name of the person that took your information.  2. Exception #2 (Planned Surgery): In the event that you are scheduled by another doctor or dentist to have any type of surgery or procedure, you are allowed (for a period no longer than 30 days), to receive additional pain medication, for the acute post-op pain. However, in this case, you are responsible for picking up a copy of our "Post-op Pain Management for Surgeons" handout, and giving it to your surgeon or dentist. This document is available at our office, and does not require an appointment to obtain it. Simply go to our office during business hours (Monday-Thursday from 8:00 AM to 4:00 PM) (Friday 8:00 AM to 12:00 Noon) or if you have a scheduled appointment with [place], prior to your surgery, and ask for it by name. In addition, you will need to provide [place] with your name, name of your surgeon, type of surgery, and date of procedure or surgery.  *Opioid medications include: morphine, codeine, oxycodone, oxymorphone, hydrocodone, hydromorphone, meperidine, tramadol, tapentadol, buprenorphine, fentanyl, methadone. **Benzodiazepine medications include: diazepam (Valium), alprazolam (Xanax),  clonazepam (Klonopine), lorazepam (Ativan), clorazepate (Tranxene), chlordiazepoxide (Librium), estazolam (Prosom), oxazepam (Serax), temazepam (Restoril), triazolam (Halcion)  Oxycodone 10/325 x 3 prescriptions  ____________________________________________________________________________________________ Estimated body mass index is 29.17 kg/m as calculated from the following:   Height as of this encounter: 4' 10.5" (1.486 m).   Weight as of this encounter: 142 lb (64.4 kg).

## 2017-06-06 LAB — TOXASSURE SELECT 13 (MW), URINE

## 2017-06-11 ENCOUNTER — Encounter: Payer: Self-pay | Admitting: Nurse Practitioner

## 2017-06-30 ENCOUNTER — Encounter: Payer: Self-pay | Admitting: Nurse Practitioner

## 2017-08-24 ENCOUNTER — Telehealth: Payer: Self-pay | Admitting: Nurse Practitioner

## 2017-08-24 NOTE — Telephone Encounter (Signed)
Patient called and instructed ok to fill vicodin Rx from broken ankle as long as prescribing doctor aware that we prescribe also. Patient also instructed that she can not get medications early. Scripts must last 30 days- no exceptions. Patient understands all instructions.

## 2017-08-24 NOTE — Telephone Encounter (Signed)
Patient broke her foot, got a script for pain meds, is it ok if she fills it, also she is out of meds from here. Please call asap.

## 2017-09-01 ENCOUNTER — Ambulatory Visit: Payer: BLUE CROSS/BLUE SHIELD | Attending: Nurse Practitioner | Admitting: Nurse Practitioner

## 2017-09-01 ENCOUNTER — Encounter: Payer: Self-pay | Admitting: Nurse Practitioner

## 2017-09-01 VITALS — BP 150/73 | HR 79 | Temp 98.4°F | Resp 16 | Ht 58.5 in | Wt 140.0 lb

## 2017-09-01 DIAGNOSIS — M47816 Spondylosis without myelopathy or radiculopathy, lumbar region: Secondary | ICD-10-CM | POA: Diagnosis not present

## 2017-09-01 DIAGNOSIS — R102 Pelvic and perineal pain: Secondary | ICD-10-CM | POA: Diagnosis not present

## 2017-09-01 DIAGNOSIS — M48061 Spinal stenosis, lumbar region without neurogenic claudication: Secondary | ICD-10-CM | POA: Diagnosis not present

## 2017-09-01 DIAGNOSIS — R7982 Elevated C-reactive protein (CRP): Secondary | ICD-10-CM | POA: Insufficient documentation

## 2017-09-01 DIAGNOSIS — Z79899 Other long term (current) drug therapy: Secondary | ICD-10-CM | POA: Insufficient documentation

## 2017-09-01 DIAGNOSIS — F17210 Nicotine dependence, cigarettes, uncomplicated: Secondary | ICD-10-CM | POA: Insufficient documentation

## 2017-09-01 DIAGNOSIS — M25571 Pain in right ankle and joints of right foot: Secondary | ICD-10-CM | POA: Diagnosis not present

## 2017-09-01 DIAGNOSIS — M1610 Unilateral primary osteoarthritis, unspecified hip: Secondary | ICD-10-CM | POA: Insufficient documentation

## 2017-09-01 DIAGNOSIS — G5601 Carpal tunnel syndrome, right upper limb: Secondary | ICD-10-CM | POA: Diagnosis not present

## 2017-09-01 DIAGNOSIS — G8929 Other chronic pain: Secondary | ICD-10-CM

## 2017-09-01 DIAGNOSIS — E041 Nontoxic single thyroid nodule: Secondary | ICD-10-CM | POA: Diagnosis not present

## 2017-09-01 DIAGNOSIS — Z833 Family history of diabetes mellitus: Secondary | ICD-10-CM | POA: Insufficient documentation

## 2017-09-01 DIAGNOSIS — M25551 Pain in right hip: Secondary | ICD-10-CM | POA: Diagnosis not present

## 2017-09-01 DIAGNOSIS — M25561 Pain in right knee: Secondary | ICD-10-CM | POA: Insufficient documentation

## 2017-09-01 DIAGNOSIS — J449 Chronic obstructive pulmonary disease, unspecified: Secondary | ICD-10-CM | POA: Diagnosis not present

## 2017-09-01 DIAGNOSIS — Z79891 Long term (current) use of opiate analgesic: Secondary | ICD-10-CM | POA: Diagnosis not present

## 2017-09-01 DIAGNOSIS — G894 Chronic pain syndrome: Secondary | ICD-10-CM

## 2017-09-01 DIAGNOSIS — M545 Low back pain: Secondary | ICD-10-CM | POA: Diagnosis not present

## 2017-09-01 DIAGNOSIS — Z9049 Acquired absence of other specified parts of digestive tract: Secondary | ICD-10-CM | POA: Insufficient documentation

## 2017-09-01 DIAGNOSIS — Z8249 Family history of ischemic heart disease and other diseases of the circulatory system: Secondary | ICD-10-CM | POA: Diagnosis not present

## 2017-09-01 DIAGNOSIS — F329 Major depressive disorder, single episode, unspecified: Secondary | ICD-10-CM | POA: Insufficient documentation

## 2017-09-01 DIAGNOSIS — M797 Fibromyalgia: Secondary | ICD-10-CM

## 2017-09-01 DIAGNOSIS — Z9889 Other specified postprocedural states: Secondary | ICD-10-CM | POA: Insufficient documentation

## 2017-09-01 DIAGNOSIS — Z791 Long term (current) use of non-steroidal anti-inflammatories (NSAID): Secondary | ICD-10-CM | POA: Insufficient documentation

## 2017-09-01 MED ORDER — OXYCODONE-ACETAMINOPHEN 10-325 MG PO TABS: 1.0000 | ORAL_TABLET | Freq: Three times a day (TID) | ORAL | 0 refills | Status: AC | PRN

## 2017-09-01 MED ORDER — GABAPENTIN 100 MG PO CAPS: 100.0000 mg | ORAL_CAPSULE | Freq: Three times a day (TID) | ORAL | 0 refills | Status: DC

## 2017-09-01 NOTE — Progress Notes (Addendum)
Patient's Name: Vicki Fox  MRN: [id number]  Referring Provider: Christie Nottingham, [place]  DOB: [birth year]  PCP: Christie Nottingham, [place]  DOS: 09/01/2017  Note by: Vevelyn Francois NP  Service setting: Ambulatory outpatient  Specialty: Interventional Pain Management  Location: [facility] (AMB) Pain Management Facility    Patient type: Established    Primary Reason(s) for Visit: Encounter for prescription drug management. (Level of risk: moderate)  CC: Back Pain (lower back bilateral); Abdominal Pain (s/p surgical events); and Ankle Pain (right s/p fx approx 3 weeks ago)  HPI  Vicki Fox is a 51 y.o. year old, female patient, who comes today for a medication management evaluation. She has Fibromyalgia; Essential hypertension; Hypothyroidism; Vitamin D deficiency; Chronic abdominal pain (Location of Secondary source of pain) (Epigastric); CN (constipation); Overweight (BMI 25.0-29.9); FH: diabetes mellitus; Long term current use of opiate analgesic; Long term prescription opiate use; Opiate use (45 MME/Day); Encounter for therapeutic drug level monitoring; Encounter for chronic pain management; Depression; Gallbladder disease; Adhesions Pelvic; Generalized abdominal pain (Location of Secondary source of pain) (Epigastric); Adhesions Abdominal; Chronic pelvic pain in female; Chronic low back pain (Location of Primary Source of Pain) (Bilateral) (L>R); Lumbar spondylosis; Chronic lower extremity pain (Bilateral) (L>R); Chronic radicular pain of lower extremity(2) (B) (S1) (L>R); Osteoarthritis of hip (Location of Tertiary source of pain) (Bilateral) (L>R); Chronic hip pain (Location of Tertiary source of pain) (Bilateral) (R>L); CRP elevated; Thyroid nodule; COPD with emphysema ([place]); Nicotine dependence; Hypomagnesemia; Tobacco abuse; Neuropathy; Neurogenic pain; Neuropathic pain; Right carpal tunnel syndrome; Lumbar facet syndrome (Location of Primary Source of Pain) (Bilateral) (L>R); Opioid-induced constipation (OIC);  Mechanical knee pain, right; Chronic pain syndrome; Chronic knee pain (Right); Osteoarthritis of knee (Right); and Lumbar stenosis without neurogenic claudication on their problem list. Her primarily concern today is the Back Pain (lower back bilateral); Abdominal Pain (s/p surgical events); and Ankle Pain (right s/p fx approx 3 weeks ago)  Pain Assessment: Location: Lower, Left, Right Back(abdomen, right ankle) Radiating: into the hips Onset: More than a month ago Duration: Chronic pain Quality: Constant, Sharp Severity: 4 /10 (self-reported pain score)  Note: Reported level is compatible with observation.                         When using our objective Pain Scale, levels between 6 and 10/10 are said to belong in an emergency room, as it progressively worsens from a 6/10, described as severely limiting, requiring emergency care not usually available at an outpatient pain management facility. At a 6/10 level, communication becomes difficult and requires great effort. Assistance to reach the emergency department may be required. Facial flushing and profuse sweating along with potentially dangerous increases in heart rate and blood pressure will be evident. Effect on ADL: patient is able to keep going, has good days and bad days.  patient does work fulltime Timing: Constant Modifying factors: medicine  Vicki Fox was last scheduled for an appointment on 08/24/2017 for medication management. During today's appointment we reviewed Vicki Fox's chronic pain status, as well as her outpatient medication regimen. She admits that she suffered a ground level fall while taking out the trash. She suffered an ankle fracture and she was seen and treated by Dr Ronnald Ramp. She is does not need surgery. She admits that she does take occasional calcium and vitamin D.  She denies any concern with her lower back pain. She admits that this pain is stable. She has never had a interventional  procedures.She admits that the back  and hips are equal in pain. She feels like the hip pain gets worse with sitting for long periods.  Her last UDS was abnormal. She admits that she got a "white pill" from her brother-in-law.   The patient  reports that she does not use drugs. Her body mass index is 28.76 kg/m.  Further details on both, my assessment(s), as well as the proposed treatment plan, please see below.  Controlled Substance Pharmacotherapy Assessment REMS (Risk Evaluation and Mitigation Strategy)  Analgesic:Oxycodone/APAP 10/325 one tablet every 8 hours when necessary (30 mg/day) MME/day:45 mg/day   [name], RN  09/01/2017  9:41 AM  Sign at close encounter Nursing Pain Medication Assessment:  Safety precautions to be maintained throughout the outpatient stay will include: orient to surroundings, keep bed in low position, maintain call bell within reach at all times, provide assistance with transfer out of bed and ambulation.  Medication Inspection Compliance: Pill count conducted under aseptic conditions, in front of the patient. Neither the pills nor the bottle was removed from the patient's sight at any time. Once count was completed pills were immediately returned to the patient in their original bottle.  Medication: Hydrocodone/APAP Pill/Patch Count: 0 of 90 pills remain Pill/Patch Appearance: Markings consistent with prescribed medication Bottle Appearance: Standard pharmacy container. Clearly labeled. Filled Date: 22 / 03 / 2018 Last Medication intake:  Today   Pharmacokinetics: Liberation and absorption (onset of action): WNL Distribution (time to peak effect): WNL Metabolism and excretion (duration of action): WNL         Pharmacodynamics: Desired effects: Analgesia: Vicki Fox reports >50% benefit. Functional ability: Patient reports that medication allows her to accomplish basic ADLs Clinically meaningful improvement in function (CMIF): Sustained CMIF goals met Perceived  effectiveness: Described as relatively effective, allowing for increase in activities of daily living (ADL) Undesirable effects: Side-effects or Adverse reactions: None reported Monitoring:  PMP: Online review of the past 93-monthperiod conducted. Compliant with practice rules and regulations Last UDS on record: Summary  Date Value Ref Range Status  06/02/2017 FINAL  Final    Comment:    ==================================================================== TOXASSURE SELECT 13 (MW) ==================================================================== Test                             Result       Flag       Units Drug Present and Declared for Prescription Verification   Oxycodone                      1290         EXPECTED   ng/mg creat   Oxymorphone                    2268         EXPECTED   ng/mg creat   Noroxycodone                   3044         EXPECTED   ng/mg creat   Noroxymorphone                 939          EXPECTED   ng/mg creat    Sources of oxycodone are scheduled prescription medications.    Oxymorphone, noroxycodone, and noroxymorphone are expected    metabolites of oxycodone. Oxymorphone is also available as a  scheduled prescription medication. Drug Present not Declared for Prescription Verification   Buprenorphine                  6            UNEXPECTED ng/mg creat   Norbuprenorphine               26           UNEXPECTED ng/mg creat    Source of buprenorphine is a scheduled prescription medication.    Norbuprenorphine is an expected metabolite of buprenorphine. ==================================================================== Test                      Result    Flag   Units      Ref Range   Creatinine              115              mg/dL      >=20 ==================================================================== Declared Medications:  The flagging and interpretation on this report are based on the  following declared medications.  Unexpected results may arise  from  inaccuracies in the declared medications.  **Note: The testing scope of this panel includes these medications:  Oxycodone (Percocet)  **Note: The testing scope of this panel does not include following  reported medications:  Acetaminophen (Percocet)  Albuterol (ProAir HFA)  Fluticasone (Flonase)  Hydrochlorothiazide (Hydrodiuril)  Ibuprofen  Levothyroxine  Polyethylene Glycol  Vitamin D3 ==================================================================== For clinical consultation, please call [phone]. ====================================================================    UDS interpretation: Non-Compliant Undeclared illicit substance detected Medication Assessment Form: Discrepancies found between patient's report and information collected Treatment compliance: Non-compliant Risk Assessment Profile: Aberrant behavior: obtaining medications from illicit sources and See prior evaluations. None observed or detected today Comorbid factors increasing risk of overdose: age 19-34 years old and caucasian Risk of substance use disorder (SUD): Very High Opioid Risk Tool - 09/01/17 0942      Family History of Substance Abuse   Alcohol  Negative    Illegal Drugs  Negative    Rx Drugs  Negative      Personal History of Substance Abuse   Alcohol  Negative    Illegal Drugs  Negative    Rx Drugs  Negative      Psychological Disease   Psychological Disease  Negative    Depression  Negative      Total Score   Opioid Risk Tool Scoring  0    Opioid Risk Interpretation  Low Risk      ORT Scoring interpretation table:  Score <3 = Low Risk for SUD  Score between 4-7 = Moderate Risk for SUD  Score >8 = High Risk for Opioid Abuse   Risk Mitigation Strategies:  Patient Counseling: Covered Patient-Prescriber Agreement (PPA): Present and active  Notification to other healthcare providers: Done  Pharmacologic Plan: Treatment plan will be modified to exclude  opioids  Laboratory Chemistry  Inflammation Markers (CRP: Acute Phase) (ESR: Chronic Phase) Lab Results  Component Value Date   CRP <0.5 12/06/2015   ESRSEDRATE 3 12/06/2015                 Rheumatology Markers No results found for: RF, ANA, LABURIC, URICUR, LYMEIGGIGMAB, [facility]              Renal Function Markers Lab Results  Component Value Date   BUN 13 12/06/2015   CREATININE 0.75 12/06/2015   GFRAA >60 12/06/2015   GFRNONAA >  60 12/06/2015                 Hepatic Function Markers Lab Results  Component Value Date   AST 19 12/06/2015   ALT 17 12/06/2015   ALBUMIN 4.6 12/06/2015   ALKPHOS 74 12/06/2015                 Electrolytes Lab Results  Component Value Date   NA 138 12/06/2015   K 3.7 12/06/2015   CL 101 12/06/2015   CALCIUM 9.6 12/06/2015   MG 1.6 (L) 12/06/2015                 Neuropathy Markers Lab Results  Component Value Date   VITAMINB12 306 12/06/2015                 Bone Pathology Markers Lab Results  Component Value Date   VD25OH 20.9 (L) 12/06/2015   VD125OH2TOT 28.6 12/06/2015                 Coagulation Parameters Lab Results  Component Value Date   PLT 192 05/17/2013                 Cardiovascular Markers Lab Results  Component Value Date   HGB 12.4 05/17/2013   HCT 35.7 05/17/2013                 CA Markers No results found for: CEA, CA125, LABCA2               Note: Lab results reviewed.  Recent Diagnostic Imaging Results  MR LUMBAR SPINE WO CONTRAST CLINICAL DATA:  Chronic low back pain without sciatica  EXAM: MRI LUMBAR SPINE WITHOUT CONTRAST  TECHNIQUE: Multiplanar, multisequence MR imaging of the lumbar spine was performed. No intravenous contrast was administered.  COMPARISON:  None.  FINDINGS: Segmentation:  Normal  Alignment:  Normal  Vertebrae: Negative for fracture or mass. Hemangioma L1 vertebral body. Normal bone marrow.  Conus medullaris: Extends to the L1-2 level and appears  normal.  Paraspinal and other soft tissues: Paraspinous muscles are well developed and symmetric. No retroperitoneal abnormality.  Disc levels:  L1-2:  Mild disc degeneration  L2-3:  Negative  L3-4:  Mild disc and mild facet degeneration without stenosis  L4-5: Disc degeneration with mild diffuse disc bulging. Moderate facet hypertrophy. Mild spinal stenosis. Mild subarticular stenosis on the left.  L5-S1:  Mild facet degeneration without stenosis.  IMPRESSION: Mild spinal stenosis L4-5 with subarticular stenosis on the left. No focal disc protrusion.  Electronically Signed   By: Franchot Gallo M.D.   On: 01/16/2017 08:41  Complexity Note: Imaging results reviewed. Results shared with Vicki Fox, using Layman's terms.                         Meds   Current Outpatient Medications:  .  Cholecalciferol (VITAMIN D3) 2000 units capsule, Take 1 capsule (2,000 Units total) by mouth daily., Disp: 30 capsule, Rfl: PRN .  fluticasone (FLONASE) 50 MCG/ACT nasal spray, instill 1 to 2 sprays into each nostril once daily, Disp: , Rfl: 0 .  hydrochlorothiazide (HYDRODIURIL) 25 MG tablet, Take 1 tablet by mouth daily at 2 PM daily at 2 PM., Disp: , Rfl: 0 .  HYDROcodone-acetaminophen (NORCO) 10-325 MG tablet, Take 1 tablet by mouth every 6 (six) hours., Disp: , Rfl: 0 .  ibuprofen (ADVIL,MOTRIN) 200 MG tablet, Take 800 mg by mouth every 8 (eight) hours as  needed., Disp: , Rfl:  .  levothyroxine (SYNTHROID, LEVOTHROID) 100 MCG tablet, Take 100 mcg by mouth daily before breakfast., Disp: , Rfl:  .  meloxicam (MOBIC) 15 MG tablet, Take 15 mg by mouth daily., Disp: , Rfl: 0 .  polyethylene glycol powder (GLYCOLAX/MIRALAX) powder, take 17GM (DISSOLVED IN WATER) by mouth once daily, Disp: 255 g, Rfl: 0 .  PROAIR HFA 108 (90 Base) MCG/ACT inhaler, 90 mcg., Disp: , Rfl: 0 .  gabapentin (NEURONTIN) 100 MG capsule, Take 1-3 capsules (100-300 mg total) by mouth 3 (three) times daily. Follow written  titration schedule., Disp: 90 capsule, Rfl: 0 .  oxyCODONE-acetaminophen (PERCOCET) 10-325 MG tablet, Take 1 tablet by mouth every 8 (eight) hours as needed for up to 21 days for pain., Disp: 63 tablet, Rfl: 0  ROS  Constitutional: Denies any fever or chills Gastrointestinal: No reported hemesis, hematochezia, vomiting, or acute GI distress Musculoskeletal: Denies any acute onset joint swelling, redness, loss of ROM, or weakness Neurological: No reported episodes of acute onset apraxia, aphasia, dysarthria, agnosia, amnesia, paralysis, loss of coordination, or loss of consciousness  Allergies  Vicki Fox has No Known Allergies.  Vicki Fox  Drug: Vicki Fox  reports that she does not use drugs. Alcohol:  reports that she drinks alcohol. Tobacco:  reports that she has been smoking cigarettes.  She has a 30.00 pack-year smoking history. she has never used smokeless tobacco. Medical:  has a past medical history of Arthritis, Asthma, Flu, and Respiratory abnormalities. Surgical: Vicki Fox  has a past surgical history that includes Cesarean section; Cholecystectomy; and Tubal ligation. Family: family history includes Diabetes in her father and mother; Heart disease in her father.  Constitutional Exam  General appearance: Well nourished, well developed, and well hydrated. In no apparent acute distress Vitals:   09/01/17 0933  BP: (!) 150/73  Pulse: 79  Resp: 16  Temp: 98.4 F (36.9 C)  TempSrc: Oral  SpO2: 99%  Weight: 140 lb (63.5 kg)  Height: 4' 10.5" (1.486 m)   BMI Assessment: Estimated body mass index is 28.76 kg/m as calculated from the following:   Height as of this encounter: 4' 10.5" (1.486 m).   Weight as of this encounter: 140 lb (63.5 kg). Psych/Mental status: Alert, oriented x 3 (person, place, & time)       Eyes: PERLA Respiratory: No evidence of acute respiratory distress  Cervical Spine Area Exam  Skin & Axial Inspection: No masses, redness, edema, swelling, or  associated skin lesions Alignment: Symmetrical Functional ROM: Unrestricted ROM      Stability: No instability detected Muscle Tone/Strength: Functionally intact. No obvious neuro-muscular anomalies detected. Sensory (Neurological): Unimpaired Palpation: No palpable anomalies              Upper Extremity (UE) Exam    Side: Right upper extremity  Side: Left upper extremity  Skin & Extremity Inspection: Skin color, temperature, and hair growth are WNL. No peripheral edema or cyanosis. No masses, redness, swelling, asymmetry, or associated skin lesions. No contractures.  Skin & Extremity Inspection: Skin color, temperature, and hair growth are WNL. No peripheral edema or cyanosis. No masses, redness, swelling, asymmetry, or associated skin lesions. No contractures.  Functional ROM: Unrestricted ROM          Functional ROM: Unrestricted ROM          Muscle Tone/Strength: Functionally intact. No obvious neuro-muscular anomalies detected.  Muscle Tone/Strength: Functionally intact. No obvious neuro-muscular anomalies detected.  Sensory (Neurological): Unimpaired  Sensory (Neurological): Unimpaired          Palpation: No palpable anomalies              Palpation: No palpable anomalies              Specialized Test(s): Deferred         Specialized Test(s): Deferred          Thoracic Spine Area Exam  Skin & Axial Inspection: No masses, redness, or swelling Alignment: Symmetrical Functional ROM: Unrestricted ROM Stability: No instability detected Muscle Tone/Strength: Functionally intact. No obvious neuro-muscular anomalies detected. Sensory (Neurological): Unimpaired Muscle strength & Tone: No palpable anomalies  Lumbar Spine Area Exam  Skin & Axial Inspection: No masses, redness, or swelling Alignment: Symmetrical Functional ROM: Unrestricted ROM      Stability: No instability detected Muscle Tone/Strength: Functionally intact. No obvious neuro-muscular anomalies detected. Sensory  (Neurological): Unimpaired Palpation: No palpable anomalies       Provocative Tests: Lumbar Hyperextension and rotation test: evaluation deferred today       Lumbar Lateral bending test: evaluation deferred today       Patrick's Maneuver: evaluation deferred today                    Gait & Posture Assessment  Ambulation: Unassisted Gait: Relatively normal for age and body habitus Posture: WNL   Lower Extremity Exam    Side: Right lower extremity  Side: Left lower extremity  Skin & Extremity Inspection: boot patent  Skin & Extremity Inspection: Skin color, temperature, and hair growth are WNL. No peripheral edema or cyanosis. No masses, redness, swelling, asymmetry, or associated skin lesions. No contractures.  Functional ROM: Unrestricted ROM          Functional ROM: Unrestricted ROM          Muscle Tone/Strength: Functionally intact. No obvious neuro-muscular anomalies detected.  Muscle Tone/Strength: Functionally intact. No obvious neuro-muscular anomalies detected.  Sensory (Neurological): Unimpaired  Sensory (Neurological): Unimpaired  Palpation: No palpable anomalies  Palpation: No palpable anomalies   Assessment  Primary Diagnosis & Pertinent Problem List: The primary encounter diagnosis was Lumbar facet syndrome (Location of Primary Source of Pain) (Bilateral) (L>R). Diagnoses of Chronic low back pain (Location of Primary Source of Pain) (Bilateral) (L>R), Chronic pain of right hip, and Chronic pain syndrome were also pertinent to this visit.  Status Diagnosis  Controlled Controlled Controlled 1. Lumbar facet syndrome (Location of Primary Source of Pain) (Bilateral) (L>R)   2. Chronic low back pain (Location of Primary Source of Pain) (Bilateral) (L>R)   3. Chronic pain of right hip   4. Chronic pain syndrome     Problems updated and reviewed during this visit: No problems updated. Plan of Care  Pharmacotherapy (Medications Ordered): Meds ordered this encounter   Medications  . gabapentin (NEURONTIN) 100 MG capsule    Sig: Take 1-3 capsules (100-300 mg total) by mouth 3 (three) times daily. Follow written titration schedule.    Dispense:  90 capsule    Refill:  0    Do not place medication on "Automatic Refill". Fill one day early if pharmacy is closed on scheduled refill date.    Order Specific Question:   Supervising Provider    Answer:   Milinda Pointer [phone]  . oxyCODONE-acetaminophen (PERCOCET) 10-325 MG tablet    Sig: Take 1 tablet by mouth every 8 (eight) hours as needed for up to 21 days for pain.    Dispense:  63 tablet  Refill:  0    Do not place this medication, or any other prescription from our practice, on "Automatic Refill". Patient may have prescription filled one day early if pharmacy is closed on scheduled refill date. Tapering dose    Order Specific Question:   Supervising Provider    Answer:   Milinda Pointer [[id number]]  This SmartLink is deprecated. Use AVSMEDLIST instead to display the medication list for a patient. Medications administered today: Vicki Fox had no medications administered during this visit. Lab-work, procedure(s), and/or referral(s): No orders of the defined types were placed in this encounter.  Imaging and/or referral(s): None  Interventional therapies: Planned, scheduled, and/or pending:  Titration of Opioid Started above   Considering:  Diagnostic, bilateral celiac plexus block Diagnostic bilateral intra-articular hip joint injection Diagnostic bilateral lumbar facet block Possible bilateral lumbar facet radiofrequencyablation. Diagnostic left sided lumbar epidural steroid injection Diagnostic superior hypogastric nerve block Diagnostic right-sided carpal tunnel injection   Palliative PRN treatment(s):  Palliative, bilateral celiac plexus block Palliativebilateral intra-articular hip joint injection Palliativebilateral lumbar facet block Palliativeleft sided  lumbar epidural steroid injection Palliativesuperior hypogastric nerve block Palliativeright-sided carpal tunnel injection    Provider-requested follow-up: Return in about 4 weeks (around 09/29/2017).  No future appointments. Primary Care Physician: Christie Nottingham, PA Location: [facility] Note by: Vevelyn Francois NP Date: 09/01/2017; Time: 10:56 AM  Pain Score Disclaimer: We use the NRS-11 scale. This is a self-reported, subjective measurement of pain severity with only modest accuracy. It is used primarily to identify changes within a particular patient. It must be understood that outpatient pain scales are significantly less accurate that those used for research, where they can be applied under ideal controlled circumstances with minimal exposure to variables. In reality, the score is likely to be a combination of pain intensity and pain affect, where pain affect describes the degree of emotional arousal or changes in action readiness caused by the sensory experience of pain. Factors such as social and work situation, setting, emotional state, anxiety levels, expectation, and prior pain experience may influence pain perception and show large inter-individual differences that may also be affected by time variables.  Patient instructions provided during this appointment: Patient Instructions

## 2017-09-01 NOTE — Patient Instructions (Addendum)
____________________________________________________________________________________________  Initial Gabapentin Titration  Medication used: Gabapentin (Generic Name) or Neurontin (Brand Name) 100 mg tablets/capsules  Reasons to stop increasing the dose:  Reason 1: You get good relief of symptoms, in which case there is no need to increase the daily dose any further.    Reason 2: You develop some side effects, such as sleeping all of the time, difficulty concentrating, or becoming disoriented, in which case you need to go down on the dose, to the prior level, where you were not experiencing any side effects. Stay on that dose longer, to allow more time for your body to get use it, before attempting to increase it again.   Steps: Step 1: Start by taking 1 (one) tablet at bedtime x 7 (seven) days.  Step 2: After being on 1 (one) tablet for 7 (seven) days, then increase it to 2 (two) tablets at bedtime for another 7 (seven) days.  Step 3: Next, after being on 2 (two) tablets at bedtime for 7 (seven) days, then increase it to 3 (three) tablets at bedtime, and stay on that dose until you see your doctor.  Reasons to stop increasing the dose: Reason 1: You get good relief of symptoms, in which case there is no need to increase the daily dose any further.  Reason 2: You develop some side effects, such as sleeping all of the time, difficulty concentrating, or becoming disoriented, in which case you need to go down on the dose, to the prior level, where you were not experiencing any side effects. Stay on that dose longer, to allow more time for your body to get use it, before attempting to increase it again.  Endpoint: Once you have reached the maximum dose you can tolerate without side-effects, contact your physician so as to evaluate the results of the regimen.   Questions: Feel free to contact us for any questions or problems at [phone]  Titrate pain medication as indicated by NP 1. 3  tabs a day as needed x 1 week 2.  2 tabs a day as needed x 1 week 3.  1 tab a day as needed x 1 week.  ____________________________________________________________________________________________

## 2017-09-01 NOTE — Progress Notes (Signed)
Nursing Pain Medication Assessment:  Safety precautions to be maintained throughout the outpatient stay will include: orient to surroundings, keep bed in low position, maintain call bell within reach at all times, provide assistance with transfer out of bed and ambulation.  Medication Inspection Compliance: Pill count conducted under aseptic conditions, in front of the patient. Neither the pills nor the bottle was removed from the patient's sight at any time. Once count was completed pills were immediately returned to the patient in their original bottle.  Medication: Hydrocodone/APAP Pill/Patch Count: 0 of 90 pills remain Pill/Patch Appearance: Markings consistent with prescribed medication Bottle Appearance: Standard pharmacy container. Clearly labeled. Filled Date: 44 / 03 / 2018 Last Medication intake:  Today

## 2017-10-05 ENCOUNTER — Ambulatory Visit: Payer: BLUE CROSS/BLUE SHIELD | Admitting: Nurse Practitioner

## 2022-01-24 ENCOUNTER — Ambulatory Visit: Payer: BLUE CROSS/BLUE SHIELD | Admitting: Family

## 2022-03-03 ENCOUNTER — Ambulatory Visit: Payer: BLUE CROSS/BLUE SHIELD | Admitting: Family

## 2022-03-03 ENCOUNTER — Encounter: Payer: Self-pay | Admitting: Family

## 2022-03-03 VITALS — BP 138/78 | HR 71 | Temp 98.8°F | Resp 16 | Ht 58.75 in | Wt 146.6 lb

## 2022-03-03 DIAGNOSIS — E559 Vitamin D deficiency, unspecified: Secondary | ICD-10-CM

## 2022-03-03 DIAGNOSIS — G5621 Lesion of ulnar nerve, right upper limb: Secondary | ICD-10-CM | POA: Diagnosis not present

## 2022-03-03 DIAGNOSIS — Z720 Tobacco use: Secondary | ICD-10-CM

## 2022-03-03 DIAGNOSIS — R5383 Other fatigue: Secondary | ICD-10-CM | POA: Diagnosis not present

## 2022-03-03 DIAGNOSIS — K219 Gastro-esophageal reflux disease without esophagitis: Secondary | ICD-10-CM

## 2022-03-03 DIAGNOSIS — D72829 Elevated white blood cell count, unspecified: Secondary | ICD-10-CM

## 2022-03-03 DIAGNOSIS — R1013 Epigastric pain: Secondary | ICD-10-CM | POA: Insufficient documentation

## 2022-03-03 DIAGNOSIS — T402X5A Adverse effect of other opioids, initial encounter: Secondary | ICD-10-CM

## 2022-03-03 DIAGNOSIS — K5903 Drug induced constipation: Secondary | ICD-10-CM

## 2022-03-03 DIAGNOSIS — E039 Hypothyroidism, unspecified: Secondary | ICD-10-CM

## 2022-03-03 DIAGNOSIS — I10 Essential (primary) hypertension: Secondary | ICD-10-CM

## 2022-03-03 DIAGNOSIS — E041 Nontoxic single thyroid nodule: Secondary | ICD-10-CM | POA: Diagnosis not present

## 2022-03-03 DIAGNOSIS — E049 Nontoxic goiter, unspecified: Secondary | ICD-10-CM

## 2022-03-03 MED ORDER — OMEPRAZOLE 20 MG PO CPDR: 20.0000 mg | DELAYED_RELEASE_CAPSULE | Freq: Every day | ORAL | 3 refills | Status: DC

## 2022-03-03 NOTE — Assessment & Plan Note (Signed)
Order US thyroid

## 2022-03-03 NOTE — Assessment & Plan Note (Signed)
Ordering h pylori Trial omeprazole possible ulcer vs heartburn

## 2022-03-03 NOTE — Assessment & Plan Note (Signed)
Referral placed for lung cancer screening clinic

## 2022-03-03 NOTE — Assessment & Plan Note (Signed)
Order b12 cbc cmp Pending results

## 2022-03-03 NOTE — Progress Notes (Signed)
New Patient Office Visit  Subjective:  Patient ID: Vicki Fox, female    DOB: 07-06-1966  Age: 56 y.o. MRN: [id number]  CC:  Chief Complaint  Patient presents with   Establish Care    HPI Vicki Fox is here to establish care as a new patient.  Prior provider was: [facility] primary, Brenton Grills , wanted to transfer care from there  Pt is without acute concerns.   chronic concerns:  Chronic back pain, on roxicodone 15 mg as well as ibuprofen 800 tid. She follows with pain management once monthly. Does take ibuprofen about every hours for chronic back pain. Dr. Mohammed Kindle.   HTN: on HCTZ 25 mg , doing well. Only checks sometimes at home. No chest pain palpitations or sob.   Hypothyroid: currently on 100 mcg levothyroxine, she was previously on 150 mcg once daily. Her last tsh was 93.739. has gained twenty pounds in the last eight months, thinning hair, polyarthralgias, feels tired all of the time. Feels restless.  Wt Readings from Last 3 Encounters:  03/03/22 146 lb 9 oz (66.5 kg)  09/01/17 140 lb (63.5 kg)  06/02/17 142 lb (64.4 kg)   Abdominal pain, has been going on for years. Mainly epigastric around where her scars are from the cholecystectomy, when it was removed in 2006. Finds she burps a lot and she passes gas a lot. She doesn't feel a lot of heart burn. Constipation but mainly because of the constipation. Does not notice any sensitivity with eating meats.      03/03/2022    2:11 PM  GAD 7 : Generalized Anxiety Score  Nervous, Anxious, on Edge 0  Control/stop worrying 0  Worry too much - different things 0  Trouble relaxing 3  Restless 0  Easily annoyed or irritable 0  Afraid - awful might happen 0  Total GAD 7 Score 3  Anxiety Difficulty Not difficult at all       03/03/2022    2:11 PM 09/01/2017    9:42 AM 06/02/2017    9:13 AM  PHQ9 SCORE ONLY  PHQ-9 Total Score 6 0 0    Past Medical History:  Diagnosis Date   Arthritis    Asthma    Flu    2018    Respiratory abnormalities    2018 - cough meds and antibiotics    Past Surgical History:  Procedure Laterality Date   CESAREAN SECTION     CHOLECYSTECTOMY     TUBAL LIGATION      Family History  Problem Relation Age of Onset   Diabetes Mother    Diabetes Father    Heart disease Father     Social History   Socioeconomic History   Marital status: Single    Spouse name: Not on file   Number of children: 2   Years of education: Not on file   Highest education level: Not on file  Occupational History    Employer: [employer]    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  Tobacco Use   Smoking status: Every Day    Packs/day: 1.00    Years: 40.00    Pack years: 40.00    Types: Cigarettes   Smokeless tobacco: Never  Vaping Use   Vaping Use: Never used  Substance and Sexual Activity   Alcohol use: Not Currently    Comment: quit drinking 2013   Drug use: No   Sexual activity: Not Currently  Other Topics Concern  Not on file  Social History Narrative   Three grandbabies   Social Determinants of Health   Financial Resource Strain: Not on file  Food Insecurity: Not on file  Transportation Needs: Not on file  Physical Activity: Not on file  Stress: Not on file  Social Connections: Not on file  Intimate Partner Violence: Not on file    Outpatient Medications Prior to Visit  Medication Sig Dispense Refill   Cholecalciferol (VITAMIN D3) 2000 units capsule Take 1 capsule (2,000 Units total) by mouth daily. 30 capsule PRN   hydrochlorothiazide (HYDRODIURIL) 25 MG tablet Take 1 tablet by mouth daily at 2 PM daily at 2 PM.  0   ibuprofen (ADVIL,MOTRIN) 200 MG tablet Take 800 mg by mouth every 8 (eight) hours as needed.     levothyroxine (SYNTHROID, LEVOTHROID) 100 MCG tablet Take 100 mcg by mouth daily before breakfast.     oxyCODONE (ROXICODONE) 15 MG immediate release tablet oxycodone 15 mg tablet     polyethylene glycol powder (GLYCOLAX/MIRALAX) powder take 17GM (DISSOLVED  IN WATER) by mouth once daily 255 g 0   fluticasone (FLONASE) 50 MCG/ACT nasal spray instill 1 to 2 sprays into each nostril once daily (Patient not taking: Reported on 03/03/2022)  0   gabapentin (NEURONTIN) 100 MG capsule Take 1-3 capsules (100-300 mg total) by mouth 3 (three) times daily. Follow written titration schedule. 90 capsule 0   HYDROcodone-acetaminophen (NORCO) 10-325 MG tablet Take 1 tablet by mouth every 6 (six) hours. (Patient not taking: Reported on 03/03/2022)  0   meloxicam (MOBIC) 15 MG tablet Take 15 mg by mouth daily. (Patient not taking: Reported on 03/03/2022)  0   PROAIR HFA 108 (90 Base) MCG/ACT inhaler 90 mcg. (Patient not taking: Reported on 03/03/2022)  0   No facility-administered medications prior to visit.    Allergies  Allergen Reactions   Gabapentin Other (See Comments)    Blurry vision        Objective:    Physical Exam Vitals reviewed.  Constitutional:      General: She is not in acute distress.    Appearance: Normal appearance. She is obese. She is not ill-appearing or toxic-appearing.  HENT:     Right Ear: Tympanic membrane normal.     Left Ear: Tympanic membrane normal.     Mouth/Throat:     Mouth: Mucous membranes are moist.     Pharynx: No pharyngeal swelling.     Tonsils: No tonsillar exudate.  Eyes:     Extraocular Movements: Extraocular movements intact.     Conjunctiva/sclera: Conjunctivae normal.     Pupils: Pupils are equal, round, and reactive to light.  Neck:     Thyroid: Thyromegaly present. No thyroid mass or thyroid tenderness.  Cardiovascular:     Rate and Rhythm: Normal rate and regular rhythm.  Pulmonary:     Effort: Pulmonary effort is normal.     Breath sounds: Normal breath sounds.  Abdominal:     General: Abdomen is flat. Bowel sounds are normal.     Palpations: Abdomen is soft.     Tenderness: There is abdominal tenderness in the epigastric area. There is no guarding. Negative signs include Murphy's sign, Rovsing's  sign, psoas sign and obturator sign.     Hernia: No hernia is present.  Musculoskeletal:        General: Normal range of motion.  Lymphadenopathy:     Cervical:     Right cervical: No superficial cervical adenopathy.  Left cervical: No superficial cervical adenopathy.  Skin:    General: Skin is warm.     Capillary Refill: Capillary refill takes less than 2 seconds.  Neurological:     General: No focal deficit present.     Mental Status: She is alert and oriented to person, place, and time.  Psychiatric:        Mood and Affect: Mood normal.        Behavior: Behavior normal.        Thought Content: Thought content normal.        Judgment: Judgment normal.      BP 138/78   Pulse 71   Temp 98.8 F (37.1 C)   Resp 16   Ht 4' 10.75" (1.492 m)   Wt 146 lb 9 oz (66.5 kg)   LMP  (LMP Unknown)   SpO2 95%   BMI 29.85 kg/m  Wt Readings from Last 3 Encounters:  03/03/22 146 lb 9 oz (66.5 kg)  09/01/17 140 lb (63.5 kg)  06/02/17 142 lb (64.4 kg)     Health Maintenance Due  Topic Date Due   COVID-19 Vaccine (1) Never done   HIV Screening  Never done   Hepatitis C Screening  Never done   TETANUS/TDAP  Never done   PAP SMEAR-Modifier  Never done   COLONOSCOPY (Pts 45-9yr Insurance coverage will need to be confirmed)  Never done   MAMMOGRAM  Never done   Zoster Vaccines- Shingrix (1 of 2) Never done    There are no preventive care reminders to display for this patient.  No results found for: TSH Lab Results  Component Value Date   WBC 12.3 (H) 05/17/2013   HGB 12.4 05/17/2013   HCT 35.7 05/17/2013   MCV 93 05/17/2013   PLT 192 05/17/2013   Lab Results  Component Value Date   NA 138 12/06/2015   K 3.7 12/06/2015   CO2 27 12/06/2015   GLUCOSE 91 12/06/2015   BUN 13 12/06/2015   CREATININE 0.75 12/06/2015   BILITOT 0.5 12/06/2015   ALKPHOS 74 12/06/2015   AST 19 12/06/2015   ALT 17 12/06/2015   PROT 7.9 12/06/2015   ALBUMIN 4.6 12/06/2015   CALCIUM 9.6  12/06/2015   ANIONGAP 10 12/06/2015   No results found for: CHOL No results found for: HDL No results found for: LDLCALC No results found for: TRIG No results found for: CHOLHDL No results found for: HGBA1C    Assessment & Plan:   Problem List Items Addressed This Visit       Cardiovascular and Mediastinum   Essential hypertension    Continue hctz 25 mg once daily  Check potassium with cmp  Pending results.  Pt advised of the following:  Continue medication as prescribed. Monitor blood pressure periodically and/or when you feel symptomatic. Goal is <130/90 on average. Ensure that you have rested for 30 minutes prior to checking your blood pressure. Record your readings and bring them to your next visit if necessary.work on a low sodium diet.         Digestive   Opioid-induced constipation (OIC) (Chronic)    Recommend daily stool softener otc  Recommend miralax daily or prn  Increase water intake Probiotic also recommended       Gastroesophageal reflux disease without esophagitis    h pylori test today rx omeprazole 20 mg once daily for thirty days  Advised pt be sure to take ibuprofen and pain meds with meal Try to decrease  and or avoid spicy foods, fried fatty foods, and also caffeine and chocolate as these can increase heartburn symptoms.         Relevant Medications   omeprazole (PRILOSEC) 20 MG capsule     Endocrine   Hypothyroidism    Suspected need for increase levothyroxine, tsh ordered pending results.         Thyroid nodule   Relevant Orders   TSH   Goiter    Order US thyroid          Nervous and Auditory   Cubital tunnel syndrome on right - Primary     Other   Tobacco abuse (Chronic)    Referral placed for lung cancer screening clinic       Relevant Orders   Ambulatory Referral Lung Cancer Screening [facility]   Vitamin D deficiency   Relevant Orders   VITAMIN D 25 Hydroxy (Vit-D Deficiency, Fractures)   Hypomagnesemia     Ordering magnesium pending results Recommended daily mag malate 400 mg for muscle cramps       Relevant Orders   Magnesium   Epigastric pain    Ordering h pylori Trial omeprazole possible ulcer vs heartburn       Relevant Orders   H. pylori breath test   Other fatigue    Order b12 cbc cmp Pending results       Relevant Orders   Comprehensive metabolic panel   X51 and Folate Panel   Leukocytosis    Repeat cbc pending results       Relevant Orders   CBC with Differential    Meds ordered this encounter  Medications   omeprazole (PRILOSEC) 20 MG capsule    Sig: Take 1 capsule (20 mg total) by mouth daily.    Dispense:  30 capsule    Refill:  3    Order Specific Question:   Supervising Provider    Answer:   Diona Browner, AMY E [[id number]]    Follow-up: Return in about 5 weeks (around 04/07/2022) for follow up on thyroid.    Eugenia Pancoast, FNP

## 2022-03-03 NOTE — Assessment & Plan Note (Signed)
Repeat cbc pending results 

## 2022-03-03 NOTE — Patient Instructions (Signed)
I have ordered a thyroid ultrasound for you.  Please call the following location to schedule as the electronic order has been sent over.  [facility]:   [place], [place] Phone [phone],  8-430 pm   Recommend daily magnesium malate for muscle cramps can find this on [place] 400 mcg once daily.   A referral was placed today for lung cancer screening Please let us know if you have not heard back within 2 weeks about the referral.  Take a daily stool softener and start a probiotic.  Continue with miralax.   Start trial of omeprazole 20 mg once daily see if this helps with your pain in your stomach.  Take this medication for two weeks, administer 30 minutes prior to breakfast each am. Try to decrease and or avoid spicy foods, fried fatty foods, and also caffeine and chocolate as these can increase heartburn symptoms.   Welcome to our clinic, I am happy to have you as my new patient. I am excited to continue on this healthcare journey with you.  Stop by the lab prior to leaving today. I will notify you of your results once received.   Please keep in mind Any my chart messages you send have up to a three business day turnaround for a response.  Phone calls may take up to a one full business day turnaround for a  response.   If you need a medication refill I recommend you request it through the pharmacy as this is easiest for [place] rather than sending a message and or phone call.   Due to recent changes in healthcare laws, you may see results of your imaging and/or laboratory studies on MyChart before I have had a chance to review them.  I understand that in some cases there may be results that are confusing or concerning to you. Please understand that not all results are received at the same time and often I may need to interpret multiple results in order to provide you with the best plan of care or course of treatment. Therefore, I ask that you please give me 2  business days to thoroughly review all your results before contacting my office for clarification. Should we see a critical lab result, you will be contacted sooner.   It was a pleasure seeing you today! Please do not hesitate to reach out with any questions and or concerns.  Regards,   Eugenia Pancoast FNP-C

## 2022-03-03 NOTE — Assessment & Plan Note (Signed)
h pylori test today rx omeprazole 20 mg once daily for thirty days  Advised pt be sure to take ibuprofen and pain meds with meal Try to decrease and or avoid spicy foods, fried fatty foods, and also caffeine and chocolate as these can increase heartburn symptoms.

## 2022-03-03 NOTE — Assessment & Plan Note (Signed)
Continue hctz 25 mg once daily  Check potassium with cmp  Pending results.  Pt advised of the following:  Continue medication as prescribed. Monitor blood pressure periodically and/or when you feel symptomatic. Goal is <130/90 on average. Ensure that you have rested for 30 minutes prior to checking your blood pressure. Record your readings and bring them to your next visit if necessary.work on a low sodium diet.

## 2022-03-03 NOTE — Assessment & Plan Note (Signed)
Suspected need for increase levothyroxine, tsh ordered pending results.

## 2022-03-03 NOTE — Assessment & Plan Note (Signed)
Ordering magnesium pending results Recommended daily mag malate 400 mg for muscle cramps

## 2022-03-03 NOTE — Assessment & Plan Note (Signed)
Recommend daily stool softener otc  Recommend miralax daily or prn  Increase water intake Probiotic also recommended

## 2022-03-04 ENCOUNTER — Telehealth: Payer: Self-pay

## 2022-03-04 ENCOUNTER — Other Ambulatory Visit: Payer: Self-pay | Admitting: Family

## 2022-03-04 DIAGNOSIS — R12 Heartburn: Secondary | ICD-10-CM

## 2022-03-04 DIAGNOSIS — R1013 Epigastric pain: Secondary | ICD-10-CM

## 2022-03-04 DIAGNOSIS — E039 Hypothyroidism, unspecified: Secondary | ICD-10-CM

## 2022-03-04 LAB — COMPREHENSIVE METABOLIC PANEL
ALT: 12 U/L (ref 0–35)
AST: 16 U/L (ref 0–37)
Albumin: 4.2 g/dL (ref 3.5–5.2)
Alkaline Phosphatase: 94 U/L (ref 39–117)
BUN: 9 mg/dL (ref 6–23)
CO2: 23 mEq/L (ref 19–32)
Calcium: 9 mg/dL (ref 8.4–10.5)
Chloride: 102 mEq/L (ref 96–112)
Creatinine, Ser: 0.75 mg/dL (ref 0.40–1.20)
GFR: 89.46 mL/min (ref >60.00)
Glucose, Bld: 87 mg/dL (ref 70–99)
Potassium: 4 mEq/L (ref 3.5–5.1)
Sodium: 136 mEq/L (ref 135–145)
Total Bilirubin: 0.5 mg/dL (ref 0.2–1.2)
Total Protein: 7.3 g/dL (ref 6.0–8.3)

## 2022-03-04 LAB — CBC WITH DIFFERENTIAL/PLATELET
Basophils Absolute: 0.1 10*3/uL (ref 0.0–0.1)
Basophils Relative: 0.8 % (ref 0.0–3.0)
Eosinophils Absolute: 0.3 10*3/uL (ref 0.0–0.7)
Eosinophils Relative: 3.2 % (ref 0.0–5.0)
HCT: 43.4 % (ref 36.0–46.0)
Hemoglobin: 14.3 g/dL (ref 12.0–15.0)
Lymphocytes Relative: 41.6 % (ref 12.0–46.0)
Lymphs Abs: 3.2 10*3/uL (ref 0.7–4.0)
MCHC: 33 g/dL (ref 30.0–36.0)
MCV: 90.8 fl (ref 78.0–100.0)
Monocytes Absolute: 0.6 10*3/uL (ref 0.1–1.0)
Monocytes Relative: 7.3 % (ref 3.0–12.0)
Neutro Abs: 3.7 10*3/uL (ref 1.4–7.7)
Neutrophils Relative %: 47.1 % (ref 43.0–77.0)
Platelets: 241 10*3/uL (ref 150.0–400.0)
RBC: 4.78 Mil/uL (ref 3.87–5.11)
RDW: 13.8 % (ref 11.5–15.5)
WBC: 7.8 10*3/uL (ref 4.0–10.5)

## 2022-03-04 LAB — B12 AND FOLATE PANEL
Folate: 9.1 ng/mL (ref >5.9)
Vitamin B-12: 315 pg/mL (ref 211–911)

## 2022-03-04 LAB — MAGNESIUM: Magnesium: 1.9 mg/dL (ref 1.5–2.5)

## 2022-03-04 LAB — TSH: TSH: 17.46 u[IU]/mL — ABNORMAL HIGH (ref 0.35–5.50)

## 2022-03-04 LAB — VITAMIN D 25 HYDROXY (VIT D DEFICIENCY, FRACTURES): VITD: 35.12 ng/mL (ref 30.00–100.00)

## 2022-03-04 MED ORDER — LEVOTHYROXINE SODIUM 125 MCG PO TABS: 125.0000 ug | ORAL_TABLET | Freq: Every day | ORAL | 1 refills | Status: DC

## 2022-03-04 NOTE — Progress Notes (Signed)
Increase levothyroxine to 125 mcg once daily sent in rx Repeat again in four weeks at f/u visit already scheduled.   Vitamin d ok, taking daily vitamin d? If not start or cont vitamin D3 2000 IU once daily.  No anemia. Negative cmp.  Magnesium ok.  B12 on lower end of normal, start b12 1000 mcg once daily

## 2022-03-04 NOTE — Telephone Encounter (Signed)
Prior auth started for Omeprazole '20MG'$  dr capsules. Darl Householder KeyHyman Bower - Rx #: [id number] Waiting for determination.

## 2022-03-05 ENCOUNTER — Telehealth: Payer: Self-pay

## 2022-03-05 ENCOUNTER — Other Ambulatory Visit: Payer: Self-pay | Admitting: Family

## 2022-03-05 DIAGNOSIS — E039 Hypothyroidism, unspecified: Secondary | ICD-10-CM

## 2022-03-05 LAB — H. PYLORI BREATH TEST: H. pylori Breath Test: NOT DETECTED

## 2022-03-05 MED ORDER — LEVOTHYROXINE SODIUM 150 MCG PO TABS: 150.0000 ug | ORAL_TABLET | Freq: Every day | ORAL | 1 refills | Status: DC

## 2022-03-05 NOTE — Telephone Encounter (Signed)
Opened in Error.

## 2022-03-05 NOTE — Progress Notes (Signed)
Yes increase to 150 mcg once daily. Sent to pharmacy.  When pt was here she reported she was on 100 mcg, sorry for confusion. I verified with pharmacy as well.

## 2022-03-05 NOTE — Telephone Encounter (Signed)
Noted  

## 2022-03-06 NOTE — Telephone Encounter (Signed)
Prior auth for Omeprazole '20MG'$  dr capsules has been denied.  This health benefit plan does not cover the following services, supplies, drugs or charges:  Any drug that is therapeutically equivalent to an over-the-counter drug where the over-the-counter products contain the same active ingredients as the prescription product at the same, or similar strengths.  Sent denial letter to scanning.

## 2022-03-07 NOTE — Progress Notes (Signed)
H pylori test was negative

## 2022-03-10 MED ORDER — PANTOPRAZOLE SODIUM 40 MG PO TBEC: 40.0000 mg | DELAYED_RELEASE_TABLET | Freq: Every day | ORAL | 0 refills | Status: DC

## 2022-03-10 NOTE — Telephone Encounter (Signed)
Called and informed pt about the rx.

## 2022-03-11 ENCOUNTER — Telehealth: Payer: Self-pay

## 2022-03-11 DIAGNOSIS — R1013 Epigastric pain: Secondary | ICD-10-CM

## 2022-03-11 NOTE — Telephone Encounter (Signed)
Prior auth started for Pantoprazole Sodium '40MG'$  dr tablets. Darl Householder Key: [id number] - Rx #: [id number] Waiting for determination.

## 2022-03-12 ENCOUNTER — Telehealth: Payer: Self-pay | Admitting: Family

## 2022-03-12 NOTE — Telephone Encounter (Signed)
Pt called about an Lung X-ray update, pt did not know if Tabitha ordered it when she saw her on 6.5.2023. Wants to speak with the nurse about this, please advise, thank you.  Callback Number: [phone]

## 2022-03-12 NOTE — Telephone Encounter (Signed)
Thank you :)

## 2022-03-12 NOTE — Telephone Encounter (Signed)
Left message to return call to our office.  

## 2022-03-14 NOTE — Telephone Encounter (Signed)
Prior auth for Pantoprazole Sodium '40MG'$  dr tablets has been denied. Darl Householder Key: [id number] - Rx #: [id number]  From [place] denial letter:  This document serves as notice of an adverse benefit determination.  We have declined to proved benefits, in whole or in part, for the requested treatment or service(s) described below.  If you think this determination was made in error, you have the right to appeal (see the enclosures to this letter for information about your appeal rights.) Case Details: Reason for Denial:  The request does not meet the definition of Medical Necessity found in the member's benefit booklet. This medication is covered when two over-the-counter (OTC) generic proton pump inhibitors (such as omeprazole, lansoprazole) have been tried and did not work, or when the member cannot take all other OTC generic proton pump inhibitor medications. In this case, only one of the alternative medications has been tried, Omeprazole.  Alternative medications include:  Lansoprazole, Esomeprazole, etc.

## 2022-03-16 MED ORDER — OMEPRAZOLE 40 MG PO CPDR: 40.0000 mg | DELAYED_RELEASE_CAPSULE | Freq: Every day | ORAL | 0 refills | Status: DC

## 2022-03-18 ENCOUNTER — Ambulatory Visit
Admission: RE | Admit: 2022-03-18 | Discharge: 2022-03-18 | Disposition: A | Discharge status: Home / Self Care | Payer: BLUE CROSS/BLUE SHIELD | Source: Ambulatory Visit | Attending: Family | Admitting: Family

## 2022-03-18 DIAGNOSIS — E039 Hypothyroidism, unspecified: Secondary | ICD-10-CM

## 2022-03-18 DIAGNOSIS — E049 Nontoxic goiter, unspecified: Secondary | ICD-10-CM

## 2022-03-18 DIAGNOSIS — E041 Nontoxic single thyroid nodule: Secondary | ICD-10-CM

## 2022-03-19 NOTE — Telephone Encounter (Signed)
Pt informed and said she would wait for them to contact her.

## 2022-03-19 NOTE — Telephone Encounter (Signed)
The Lung Cancer Screening Program is based out the Main Pulmonary office in [place] now. [facility] shut down 1-2 years ago -- all patients have been absorbed by the [facility] office.  If she is wanting to be seen then she will have to go the [place] office to be consulted with -- they will call to screen her and and schedule her. If there are any scans needed then she can have those done at [facility] -- they will order for [facility] locations.   [facility] Address: [address] [place], [place] [address] Phone: [phone]

## 2022-03-20 ENCOUNTER — Telehealth: Payer: Self-pay

## 2022-03-20 NOTE — Progress Notes (Signed)
Thyroid ultrasound consistent with thyroid disease.  Continue current dose, we will continue to monitor. No nodules seen on exam.

## 2022-04-10 ENCOUNTER — Encounter: Payer: Self-pay | Admitting: Family

## 2022-04-10 ENCOUNTER — Ambulatory Visit (INDEPENDENT_AMBULATORY_CARE_PROVIDER_SITE_OTHER): Payer: BLUE CROSS/BLUE SHIELD | Admitting: Family

## 2022-04-10 VITALS — BP 124/78 | HR 79 | Temp 97.9°F | Resp 16 | Ht 58.75 in | Wt 151.5 lb

## 2022-04-10 DIAGNOSIS — R1013 Epigastric pain: Secondary | ICD-10-CM | POA: Diagnosis not present

## 2022-04-10 DIAGNOSIS — E039 Hypothyroidism, unspecified: Secondary | ICD-10-CM | POA: Diagnosis not present

## 2022-04-10 DIAGNOSIS — R12 Heartburn: Secondary | ICD-10-CM | POA: Diagnosis not present

## 2022-04-10 DIAGNOSIS — E6609 Other obesity due to excess calories: Secondary | ICD-10-CM

## 2022-04-10 DIAGNOSIS — Z6830 Body mass index (BMI) 30.0-30.9, adult: Secondary | ICD-10-CM

## 2022-04-10 DIAGNOSIS — I10 Essential (primary) hypertension: Secondary | ICD-10-CM

## 2022-04-10 DIAGNOSIS — Z1211 Encounter for screening for malignant neoplasm of colon: Secondary | ICD-10-CM | POA: Diagnosis not present

## 2022-04-10 MED ORDER — PANTOPRAZOLE SODIUM 40 MG PO TBEC: 40.0000 mg | DELAYED_RELEASE_TABLET | Freq: Every day | ORAL | 1 refills | Status: DC

## 2022-04-10 NOTE — Progress Notes (Signed)
Established Patient Office Visit  Subjective:  Patient ID: Vicki Fox, female    DOB: 05/30/1966  Age: 56 y.o. MRN: [id number]  CC:  Chief Complaint  Patient presents with   Thyroid Problem    HPI Vicki Fox is here today for follow up.   Pt is with acute concerns.  Still with five pound weight gain since last visit. Feels fatigue. Has dry skin and hair thinning easily. Still taking levothyroxine 150 mcg once daily.   Wt Readings from Last 3 Encounters:  04/10/22 151 lb 8 oz (68.7 kg)  03/03/22 146 lb 9 oz (66.5 kg)  09/01/17 140 lb (63.5 kg)       Past Medical History:  Diagnosis Date   Arthritis    Asthma    Flu    2018   Respiratory abnormalities    2018 - cough meds and antibiotics    Past Surgical History:  Procedure Laterality Date   CESAREAN SECTION     CHOLECYSTECTOMY     TUBAL LIGATION      Family History  Problem Relation Age of Onset   Diabetes Mother    Diabetes Father    Heart disease Father     Social History   Socioeconomic History   Marital status: Single    Spouse name: Not on file   Number of children: 2   Years of education: Not on file   Highest education level: Not on file  Occupational History    Employer: [employer]    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  Tobacco Use   Smoking status: Every Day    Packs/day: 1.00    Years: 40.00    Total pack years: 40.00    Types: Cigarettes   Smokeless tobacco: Never  Vaping Use   Vaping Use: Never used  Substance and Sexual Activity   Alcohol use: Not Currently    Comment: quit drinking 2013   Drug use: No   Sexual activity: Not Currently  Other Topics Concern   Not on file  Social History Narrative   Three grandbabies   Social Determinants of Health   Financial Resource Strain: Not on file  Food Insecurity: Not on file  Transportation Needs: Not on file  Physical Activity: Not on file  Stress: Not on file  Social Connections: Not on file  Intimate Partner  Violence: Not on file    Outpatient Medications Prior to Visit  Medication Sig Dispense Refill   Cholecalciferol (VITAMIN D3) 2000 units capsule Take 1 capsule (2,000 Units total) by mouth daily. 30 capsule PRN   hydrochlorothiazide (HYDRODIURIL) 25 MG tablet Take 1 tablet by mouth daily at 2 PM daily at 2 PM.  0   ibuprofen (ADVIL,MOTRIN) 200 MG tablet Take 800 mg by mouth every 8 (eight) hours as needed.     oxyCODONE (ROXICODONE) 15 MG immediate release tablet oxycodone 15 mg tablet     polyethylene glycol powder (GLYCOLAX/MIRALAX) powder take 17GM (DISSOLVED IN WATER) by mouth once daily 255 g 0   levothyroxine (SYNTHROID) 150 MCG tablet Take 1 tablet (150 mcg total) by mouth daily. 30 tablet 1   omeprazole (PRILOSEC) 40 MG capsule Take 1 capsule (40 mg total) by mouth daily. 30 capsule 0   No facility-administered medications prior to visit.    Allergies  Allergen Reactions   Gabapentin Other (See Comments)    Blurry vision    Influenza Virus Vaccine Hives      Review of  Systems  Respiratory:  Negative for shortness of breath.   Cardiovascular:  Negative for chest pain and palpitations.  Gastrointestinal:  Negative for constipation and diarrhea.  Genitourinary:  Negative for dysuria, frequency and urgency.  Musculoskeletal:  Negative for myalgias.  Psychiatric/Behavioral:  Negative for depression and suicidal ideas.   All other systems reviewed and are negative.    Objective:    Physical Exam Vitals reviewed.  Constitutional:      General: She is not in acute distress.    Appearance: Normal appearance. She is obese. She is not ill-appearing or toxic-appearing.  HENT:     Mouth/Throat:     Pharynx: No pharyngeal swelling.     Tonsils: No tonsillar exudate.  Neck:     Thyroid: Thyromegaly present. No thyroid mass.  Cardiovascular:     Rate and Rhythm: Normal rate and regular rhythm.  Pulmonary:     Effort: Pulmonary effort is normal.     Breath sounds: Normal  breath sounds.  Lymphadenopathy:     Cervical:     Right cervical: No superficial cervical adenopathy.    Left cervical: No superficial cervical adenopathy.  Skin:    General: Skin is warm.     Capillary Refill: Capillary refill takes less than 2 seconds.  Neurological:     General: No focal deficit present.     Mental Status: She is alert and oriented to person, place, and time.  Psychiatric:        Mood and Affect: Mood normal.        Behavior: Behavior normal.        Thought Content: Thought content normal.        Judgment: Judgment normal.       BP 124/78   Pulse 79   Temp 97.9 F (36.6 C)   Resp 16   Ht 4' 10.75" (1.492 m)   Wt 151 lb 8 oz (68.7 kg)   LMP  (LMP Unknown)   SpO2 98%   BMI 30.86 kg/m  Wt Readings from Last 3 Encounters:  04/10/22 151 lb 8 oz (68.7 kg)  03/03/22 146 lb 9 oz (66.5 kg)  09/01/17 140 lb (63.5 kg)     Health Maintenance Due  Topic Date Due   COVID-19 Vaccine (1) Never done   HIV Screening  Never done   Hepatitis C Screening  Never done   TETANUS/TDAP  Never done   PAP SMEAR-Modifier  Never done   COLONOSCOPY (Pts 45-7yr Insurance coverage will need to be confirmed)  Never done   MAMMOGRAM  Never done   Zoster Vaccines- Shingrix (1 of 2) Never done    There are no preventive care reminders to display for this patient.  Lab Results  Component Value Date   TSH 0.25 (L) 04/10/2022   Lab Results  Component Value Date   WBC 7.8 03/03/2022   HGB 14.3 03/03/2022   HCT 43.4 03/03/2022   MCV 90.8 03/03/2022   PLT 241.0 03/03/2022   Lab Results  Component Value Date   NA 136 03/03/2022   K 4.0 03/03/2022   CO2 23 03/03/2022   GLUCOSE 87 03/03/2022   BUN 9 03/03/2022   CREATININE 0.75 03/03/2022   BILITOT 0.5 03/03/2022   ALKPHOS 94 03/03/2022   AST 16 03/03/2022   ALT 12 03/03/2022   PROT 7.3 03/03/2022   ALBUMIN 4.2 03/03/2022   CALCIUM 9.0 03/03/2022   ANIONGAP 10 12/06/2015   GFR 89.46 03/03/2022   No results  found  for: "CHOL" No results found for: "HDL" No results found for: "LDLCALC" No results found for: "TRIG" No results found for: "CHOLHDL" No results found for: "HGBA1C"    Assessment & Plan:   Problem List Items Addressed This Visit       Cardiovascular and Mediastinum   Essential hypertension   Relevant Orders   Amb Ref to Medical Weight Management     Endocrine   Hypothyroidism    Continue levothyroxine 150 mcg once daily Pending tsh check today for change in dose if needed May consider brand name      Relevant Orders   Amb Ref to Medical Weight Management   TSH (Completed)     Other   Epigastric pain - Primary    rx protonix 40 mg once daily Try to decrease and or avoid spicy foods, fried fatty foods, and also caffeine and chocolate as these can increase heartburn symptoms.        Relevant Medications   pantoprazole (PROTONIX) 40 MG tablet   Other Relevant Orders   Ambulatory referral to Gastroenterology   Screening for colon cancer    Referred to GI for colonoscopy.        Relevant Orders   Ambulatory referral to Gastroenterology   Heartburn   Relevant Medications   pantoprazole (PROTONIX) 40 MG tablet   Other Relevant Orders   Ambulatory referral to Gastroenterology   Class 1 obesity due to excess calories with serious comorbidity and body mass index (BMI) of 30.0 to 30.9 in adult    Referred pt to weight loss management  Work on diet and exercise as tolerated      Relevant Orders   Amb Ref to Medical Weight Management    Meds ordered this encounter  Medications   pantoprazole (PROTONIX) 40 MG tablet    Sig: Take 1 tablet (40 mg total) by mouth daily.    Dispense:  30 tablet    Refill:  1    Order Specific Question:   Supervising Provider    Answer:   BEDSOLE, AMY E [[id number]]    Follow-up: No follow-ups on file.    Eugenia Pancoast, FNP

## 2022-04-10 NOTE — Patient Instructions (Signed)
A referral was placed today for weight loss management.  Please let us know if you have not heard back within 2 weeks about the referral.  Stop by the lab prior to leaving today. I will notify you of your results once received.   Due to recent changes in healthcare laws, you may see results of your imaging and/or laboratory studies on MyChart before I have had a chance to review them.  I understand that in some cases there may be results that are confusing or concerning to you. Please understand that not all results are received at the same time and often I may need to interpret multiple results in order to provide you with the best plan of care or course of treatment. Therefore, I ask that you please give me 2 business days to thoroughly review all your results before contacting my office for clarification. Should we see a critical lab result, you will be contacted sooner.   It was a pleasure seeing you today! Please do not hesitate to reach out with any questions and or concerns.  Regards,   Eugenia Pancoast FNP-C

## 2022-04-11 ENCOUNTER — Telehealth: Payer: Self-pay

## 2022-04-11 ENCOUNTER — Other Ambulatory Visit: Payer: Self-pay | Admitting: Family

## 2022-04-11 DIAGNOSIS — E039 Hypothyroidism, unspecified: Secondary | ICD-10-CM

## 2022-04-11 DIAGNOSIS — E6609 Other obesity due to excess calories: Secondary | ICD-10-CM | POA: Insufficient documentation

## 2022-04-11 LAB — TSH: TSH: 0.25 u[IU]/mL — ABNORMAL LOW (ref 0.35–5.50)

## 2022-04-11 MED ORDER — LEVOTHYROXINE SODIUM 150 MCG PO TABS: ORAL_TABLET | ORAL | 1 refills | Status: DC

## 2022-04-11 NOTE — Assessment & Plan Note (Signed)
Continue levothyroxine 150 mcg once daily Pending tsh check today for change in dose if needed May consider brand name

## 2022-04-11 NOTE — Telephone Encounter (Signed)
Patient calling in asking for a call back about lab results.

## 2022-04-11 NOTE — Telephone Encounter (Signed)
Called and spoke to pt about lab results. 

## 2022-04-11 NOTE — Assessment & Plan Note (Signed)
Referred to GI for colonoscopy 

## 2022-04-11 NOTE — Assessment & Plan Note (Signed)
Referred pt to weight loss management  Work on diet and exercise as tolerated

## 2022-04-11 NOTE — Assessment & Plan Note (Signed)
rx protonix 40 mg once daily Try to decrease and or avoid spicy foods, fried fatty foods, and also caffeine and chocolate as these can increase heartburn symptoms.

## 2022-04-11 NOTE — Progress Notes (Signed)
Vicki Fox,   Now we have went the complete opposite direction with your thyroid. Now it is more towards hyperthyroid. I am sending in BRAND synthroid, not levothyroxine. Ensure when picking up it Is BRAND. This will hopefully allow for less fluctuation.   I want you to take BRAND 150 mcg (same dose) but take it six days a week, off the seventh day. For ex: take M-Saturday don't take Sunday.   Repeat thyroid in four weeks, lab only appt.

## 2022-04-14 ENCOUNTER — Telehealth: Payer: Self-pay

## 2022-04-14 NOTE — Telephone Encounter (Signed)
Thank you :)

## 2022-04-14 NOTE — Telephone Encounter (Signed)
Prior auth started for Pantoprazole Sodium '40MG'$  dr tablets. Darl Householder KeyJay Schlichter - Rx #: [id number] Waiting for determination.

## 2022-04-16 NOTE — Telephone Encounter (Signed)
Noted  

## 2022-04-16 NOTE — Telephone Encounter (Signed)
Received message from Cover My Meds re:  Prior auth for Pantoprazole Sodium '40MG'$  dr tablets.  This PA was marked at cancelled, requires PA.  I called [facility] to see if they had gotten a reason as to why it was cancelled.  On their end, all they could see was that it was still pending PA.  I started a renewal on the PA.   New PA key info:  Pierra Yera Key: [place] Waiting for determination.

## 2022-04-18 ENCOUNTER — Other Ambulatory Visit: Payer: Self-pay | Admitting: Family

## 2022-04-18 DIAGNOSIS — R1013 Epigastric pain: Secondary | ICD-10-CM

## 2022-04-18 MED ORDER — LANSOPRAZOLE 30 MG PO TBDD: 30.0000 mg | DELAYED_RELEASE_TABLET | Freq: Every day | ORAL | 0 refills | Status: AC

## 2022-04-18 NOTE — Progress Notes (Signed)
anso

## 2022-04-18 NOTE — Telephone Encounter (Signed)
Pantoprazole 40 mg was not covered by insurance.  Lansoprazole was sent instead as this appears to be covered.

## 2022-04-18 NOTE — Telephone Encounter (Signed)
Prior auth for Pantoprazole Sodium '40MG'$  dr tablets has been denied. Darl Householder Key: [place]  Response from Cover My Meds: This document serves as notice of an adverse benefit determination.  We have declined to provide benefits, in whole or in part, for the requested treatment or service described below.    Service(s): Pantoprazole Sodium '40MG'$  OR TBEC  Reason for denial: An additional clinical pharmacist review determined that the request does not meet the definition of Medical Necessity found in the member's benefit booklet.  After review of more clinical information, the request for coverage of Omeprazole remains denied.  This medication is covered when two over-the-counter (OTC) generic proton pump inhibitors (such as omeprazole, lansoprazole) have been tried and did not work, or when the member cannot take all other OTC generic proton pump inhibitor medications.  In this case, only one of the alternative medications has been tried, Omeprazole.  Alternative medications include:  Lansoprazole, Esomeprazole, etc.  Please note: the alternative medications are over-the-counter and are not covered by pharmacy benefits.

## 2022-04-21 ENCOUNTER — Telehealth: Payer: Self-pay

## 2022-04-21 NOTE — Telephone Encounter (Signed)
Prior auth started for Lansoprazole '30MG'$  dr dispersible tablets. KESLEIGH MORSON Key: [id number] - Rx #: [id number] Waiting for determination.

## 2022-04-22 NOTE — Telephone Encounter (Signed)
Noted  

## 2022-04-25 NOTE — Telephone Encounter (Signed)
Prior auth for Lansoprazole '30MG'$  dr dispersible tablets has been denied, no reason given on Cover My Meds.  I called and spoke to the pharmacist at [facility].  He stated that the PA was probably denied because this Rx was written for Prevacid Solutabs which are much more expensive, but this patient does not have any trouble swallowing pills.  He said to write the Rx for regular Prevacid and we would have a much better chance of the PA being approved.

## 2022-05-09 ENCOUNTER — Other Ambulatory Visit (INDEPENDENT_AMBULATORY_CARE_PROVIDER_SITE_OTHER): Payer: BLUE CROSS/BLUE SHIELD

## 2022-05-09 DIAGNOSIS — E039 Hypothyroidism, unspecified: Secondary | ICD-10-CM | POA: Diagnosis not present

## 2022-05-09 NOTE — Addendum Note (Signed)
Addended by: Ellamae Sia on: 05/09/2022 03:16 PM   Modules accepted: Orders

## 2022-05-10 LAB — TSH: TSH: 0.01 mIU/L — ABNORMAL LOW

## 2022-05-12 ENCOUNTER — Other Ambulatory Visit: Payer: Self-pay | Admitting: Family

## 2022-05-12 DIAGNOSIS — R7989 Other specified abnormal findings of blood chemistry: Secondary | ICD-10-CM

## 2022-05-12 DIAGNOSIS — E039 Hypothyroidism, unspecified: Secondary | ICD-10-CM

## 2022-05-12 MED ORDER — LEVOTHYROXINE SODIUM 112 MCG PO TABS: 112.0000 ug | ORAL_TABLET | Freq: Every day | ORAL | 1 refills | Status: DC

## 2022-05-12 NOTE — Progress Notes (Signed)
Thyroid still trending down despite switch.  Decrease levothyroxine to 112 mcg once daily.  I am going to refer you to endo as well.  Repeat tsh in four weeks.

## 2022-05-13 NOTE — Progress Notes (Signed)
Lab only is fine 

## 2022-06-09 ENCOUNTER — Encounter: Payer: Self-pay | Admitting: "Endocrinology

## 2022-06-09 ENCOUNTER — Telehealth: Payer: Self-pay | Admitting: Family

## 2022-06-09 ENCOUNTER — Ambulatory Visit: Payer: BLUE CROSS/BLUE SHIELD | Admitting: "Endocrinology

## 2022-06-09 VITALS — BP 130/78 | HR 72 | Ht 58.75 in | Wt 147.6 lb

## 2022-06-09 DIAGNOSIS — F17200 Nicotine dependence, unspecified, uncomplicated: Secondary | ICD-10-CM | POA: Diagnosis not present

## 2022-06-09 DIAGNOSIS — M160 Bilateral primary osteoarthritis of hip: Secondary | ICD-10-CM

## 2022-06-09 DIAGNOSIS — M47816 Spondylosis without myelopathy or radiculopathy, lumbar region: Secondary | ICD-10-CM

## 2022-06-09 DIAGNOSIS — E039 Hypothyroidism, unspecified: Secondary | ICD-10-CM | POA: Diagnosis not present

## 2022-06-09 DIAGNOSIS — M1711 Unilateral primary osteoarthritis, right knee: Secondary | ICD-10-CM

## 2022-06-09 DIAGNOSIS — M797 Fibromyalgia: Secondary | ICD-10-CM

## 2022-06-09 DIAGNOSIS — G5621 Lesion of ulnar nerve, right upper limb: Secondary | ICD-10-CM

## 2022-06-09 NOTE — Progress Notes (Signed)
Endocrinology Consult Note                                            06/09/2022, 4:09 PM   Subjective:    Patient ID: Vicki Fox, female    DOB: [birth year], PCP Eugenia Pancoast, FNP   Past Medical History:  Diagnosis Date   Arthritis    Asthma    Flu    2018   Respiratory abnormalities    2018 - cough meds and antibiotics   Past Surgical History:  Procedure Laterality Date   CESAREAN SECTION     CHOLECYSTECTOMY     TUBAL LIGATION     Social History   Socioeconomic History   Marital status: Single    Spouse name: Not on file   Number of children: 2   Years of education: Not on file   Highest education level: Not on file  Occupational History    Employer: [employer]    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  Tobacco Use   Smoking status: Every Day    Packs/day: 1.00    Years: 40.00    Total pack years: 40.00    Types: Cigarettes   Smokeless tobacco: Never  Vaping Use   Vaping Use: Never used  Substance and Sexual Activity   Alcohol use: Not Currently    Comment: quit drinking 2013   Drug use: No   Sexual activity: Not Currently  Other Topics Concern   Not on file  Social History Narrative   Three grandbabies   Social Determinants of Health   Financial Resource Strain: Not on file  Food Insecurity: Not on file  Transportation Needs: Not on file  Physical Activity: Not on file  Stress: Not on file  Social Connections: Not on file   Family History  Problem Relation Age of Onset   Kidney disease Mother    Hypertension Mother    Diabetes Mother    Diabetes Father    Heart disease Father    Outpatient Encounter Medications as of 06/09/2022  Medication Sig   Cholecalciferol (VITAMIN D3) 2000 units capsule Take 1 capsule (2,000 Units total) by mouth daily.   hydrochlorothiazide (HYDRODIURIL) 25 MG tablet Take 1 tablet by mouth daily at 2 PM daily at 2 PM.   ibuprofen (ADVIL,MOTRIN) 200 MG tablet Take 800 mg by mouth every 8 (eight) hours  as needed.   lansoprazole (PREVACID SOLUTAB) 30 MG disintegrating tablet Take 1 tablet (30 mg total) by mouth daily at 12 noon.   levothyroxine (SYNTHROID) 112 MCG tablet Take 1 tablet (112 mcg total) by mouth daily.   oxyCODONE (ROXICODONE) 15 MG immediate release tablet oxycodone 15 mg tablet   polyethylene glycol powder (GLYCOLAX/MIRALAX) powder take 17GM (DISSOLVED IN WATER) by mouth once daily   No facility-administered encounter medications on file as of 06/09/2022.   ALLERGIES: Allergies  Allergen Reactions   Gabapentin Other (See Comments)    Blurry vision    Influenza Virus Vaccine Hives    VACCINATION STATUS:  There is no immunization history on file for this patient.  HPI Vicki Fox is 56 y.o. female who presents today with a medical history as above. she is being seen in consultation for hypothyroidism requested by Eugenia Pancoast, [place].  History is obtained directly from the patient as well as chart review.  She reports that she was diagnosed with hypothyroidism at approximate age of 47 years. Denies any history of thyroidectomy nor thyroid ablation.  She was treated with various dose of levothyroxine over the years.  She is currently on levothyroxine 112 mcg p.o. daily before breakfast. Over the years, she did have fluctuating thyroid function tests, more recently indicating over replacement. She denies dysphagia, shortness of breath, nor voice change.  She is a chronic heavy smoker, with dyspnea on exertion.  She denies palpitations, tremor, heat/cold intolerance.  Thyroid ultrasound recently showed shrunk gland with 2.6 cm right lobe and 2.4 cm left lobe.  Review of Systems  Constitutional: no recent weight gain/loss, no fatigue, no subjective hyperthermia, no subjective hypothermia Eyes: no blurry vision, no xerophthalmia ENT: no sore throat, no nodules palpated in throat, no dysphagia/odynophagia, no hoarseness Cardiovascular: no Chest Pain, no Shortness of  Breath, no palpitations, no leg swelling Respiratory: + cough, + shortness of breath on exertion Gastrointestinal: no Nausea/Vomiting/Diarhhea Musculoskeletal: no muscle/joint aches Skin: no rashes Neurological: no tremors, no numbness, no tingling, no dizziness Psychiatric: no depression, no anxiety  Objective:       06/09/2022   10:05 AM 04/10/2022    3:22 PM 03/03/2022    2:03 PM  Vitals with BMI  Height 4' 10.75" 4' 10.75" 4' 10.75"  Weight 147 lbs 10 oz 151 lbs 8 oz 146 lbs 9 oz  BMI 30.08 54.65 03.54  Systolic 656 812 751  Diastolic 78 78 78  Pulse 72 79 71    BP 130/78   Pulse 72   Ht 4' 10.75" (1.492 m)   Wt 147 lb 9.6 oz (67 kg)   LMP  (LMP Unknown)   BMI 30.07 kg/m   Wt Readings from Last 3 Encounters:  06/09/22 147 lb 9.6 oz (67 kg)  04/10/22 151 lb 8 oz (68.7 kg)  03/03/22 146 lb 9 oz (66.5 kg)    Physical Exam  Constitutional:  Body mass index is 30.07 kg/m.,  not in acute distress, normal state of mind Eyes: PERRLA, EOMI, no exophthalmos ENT: moist mucous membranes, no gross thyromegaly, no gross cervical lymphadenopathy Cardiovascular: normal precordial activity, Regular Rate and Rhythm, no Murmur/Rubs/Gallops Respiratory:  adequate breathing efforts, no gross chest deformity,  + wheezes on bilateral lung fields.    Gastrointestinal: abdomen soft, Non -tender, No distension, Bowel Sounds present, no gross organomegaly Musculoskeletal: no gross deformities, strength intact in all four extremities Skin: moist, warm, no rashes Neurological: no tremor with outstretched hands, Deep tendon reflexes normal in bilateral lower extremities.  CMP ( most recent) CMP     Component Value Date/Time   NA 136 03/03/2022 1438   NA 138 08/11/2014 1228   K 4.0 03/03/2022 1438   K 3.1 (L) 08/11/2014 1228   CL 102 03/03/2022 1438   CL 103 08/11/2014 1228   CO2 23 03/03/2022 1438   CO2 28 08/11/2014 1228   GLUCOSE 87 03/03/2022 1438   GLUCOSE 97 08/11/2014 1228    BUN 9 03/03/2022 1438   BUN 14 08/11/2014 1228   CREATININE 0.75 03/03/2022 1438   CREATININE 0.78 08/11/2014 1228   CALCIUM 9.0 03/03/2022 1438   CALCIUM 8.2 (L) 08/11/2014 1228   PROT 7.3 03/03/2022 1438   PROT 7.1 08/11/2014 1228   ALBUMIN 4.2 03/03/2022 1438   ALBUMIN 3.6 08/11/2014 1228   AST 16 03/03/2022 1438   AST 20 08/11/2014 1228   ALT 12 03/03/2022 1438   ALT 23 08/11/2014 1228  ALKPHOS 94 03/03/2022 1438   ALKPHOS 80 08/11/2014 1228   BILITOT 0.5 03/03/2022 1438   BILITOT 0.5 08/11/2014 1228   GFRNONAA >60 12/06/2015 1441   GFRNONAA >60 08/11/2014 1228   GFRNONAA >60 05/11/2013 1135   GFRAA >60 12/06/2015 1441   GFRAA >60 08/11/2014 1228   GFRAA >60 05/11/2013 1135     Lab Results  Component Value Date   TSH 0.01 (L) 05/09/2022   TSH 0.25 (L) 04/10/2022   TSH 17.46 (H) 03/03/2022      Assessment & Plan:   1. Hypothyroidism, unspecified type 2. Current smoker   - MAKI HEGE  is being seen at a kind request of Eugenia Pancoast, [place]. - I have reviewed her available thyroid records and clinically evaluated the patient. - Based on these reviews, she has poor thyroidism with evidence of over replacement. Likely need to lower adjustment in her thyroid hormone dose.  However, she will be sent for new set of thyroid function tests today and will return in 2 weeks to discuss her results. The meantime, she is advised to continue on levothyroxine 112 mcg p.o. daily before breakfast.   - We discussed about the correct intake of her thyroid hormone, on empty stomach at fasting, with water, separated by at least 30 minutes from breakfast and other medications,  and separated by more than 4 hours from calcium, iron, multivitamins, acid reflux medications (PPIs). -Patient is made aware of the fact that thyroid hormone replacement is needed for life, dose to be adjusted by periodic monitoring of thyroid function tests. I discussed her thyroid ultrasound result, does not  need any intervention at this time.  Her major health risk is her smoking. The patient was counseled on the dangers of tobacco use, and was advised to quit.  Reviewed strategies to maximize success, including removing cigarettes and smoking materials from environment.    - I did not initiate any new prescriptions today. - she is advised to maintain close follow up with Eugenia Pancoast, FNP for primary care needs.   - Time spent with the patient: 50 minutes, of which >50% was spent in  counseling her about her hypothyroidism, chronic smoking and the rest in obtaining information about her symptoms, reviewing her previous labs/studies ( including abstractions from other facilities),  evaluations, and treatments,  and developing a plan to confirm diagnosis and long term treatment based on the latest standards of care/guidelines; and documenting her care.  Vicki Fox participated in the discussions, expressed understanding, and voiced agreement with the above plans.  All questions were answered to her satisfaction. she is encouraged to contact clinic should she have any questions or concerns prior to her return visit.  Follow up plan: Return in about 10 days (around 06/19/2022) for Labs Today- Non-Fasting Ok.   Glade Lloyd, MD [facility] [place] [address] [place], [place] [address] Phone: [phone]  Fax: [phone]     06/09/2022, 4:09 PM  This note was partially dictated with voice recognition software. Similar sounding words can be transcribed inadequately or may not  be corrected upon review.

## 2022-06-09 NOTE — Telephone Encounter (Signed)
Pt called in requesting a call back [phone]

## 2022-06-09 NOTE — Telephone Encounter (Signed)
Pt called in and wanted to let us know that her pain management dr is retiring Sept 30. She wanted to know what to do about it because she needs her medication. She did not know if you handled it or if they would.

## 2022-06-09 NOTE — Telephone Encounter (Signed)
Please advise patient that I do not do chronic pain management here however I have placed a referral for a pain management doctor if she does not hear anything in the next few weeks please let us know.  She can also call at her own leisure to pain management clinics that are available on her insurance if she would like and I am happy to provide a referral.  Thank you for reaching out

## 2022-06-09 NOTE — Telephone Encounter (Signed)
Called and informed pt.  

## 2022-06-12 NOTE — Telephone Encounter (Signed)
Patient called in to follow up on this matter. Thank you!

## 2022-06-12 NOTE — Telephone Encounter (Signed)
Patient called back states that she did not want to go to [place]. Would like referral sent to office in [place].

## 2022-06-12 NOTE — Telephone Encounter (Signed)
Please have patient call her insurance and see who is covered under her insurance for pain management.  Then advised her to pick a place in [place] and give me the name of the physician so I can change the referral accordingly

## 2022-06-12 NOTE — Telephone Encounter (Signed)
Left message to return call to our office.  

## 2022-06-13 NOTE — Telephone Encounter (Signed)
Called patient she will check with her insurance and find out who is covered she will let our office know.  No further action needed at this time.

## 2022-06-14 LAB — THYROGLOBULIN ANTIBODY: Thyroglobulin Antibody: 3.3 IU/mL — ABNORMAL HIGH (ref 0.0–0.9)

## 2022-06-14 LAB — TSH: TSH: 0.044 u[IU]/mL — ABNORMAL LOW (ref 0.450–4.500)

## 2022-06-14 LAB — T4, FREE: Free T4: 1.72 ng/dL (ref 0.82–1.77)

## 2022-06-14 LAB — THYROID PEROXIDASE ANTIBODY: Thyroperoxidase Ab SerPl-aCnc: 369 IU/mL — ABNORMAL HIGH (ref 0–34)

## 2022-06-17 ENCOUNTER — Other Ambulatory Visit: Payer: BLUE CROSS/BLUE SHIELD

## 2022-06-20 ENCOUNTER — Ambulatory Visit: Payer: BLUE CROSS/BLUE SHIELD | Admitting: "Endocrinology

## 2022-06-30 ENCOUNTER — Telehealth: Payer: Self-pay | Admitting: "Endocrinology

## 2022-06-30 DIAGNOSIS — E039 Hypothyroidism, unspecified: Secondary | ICD-10-CM

## 2022-06-30 NOTE — Telephone Encounter (Signed)
New message    Patient cancel appt for tomorrow with Dr. Dorris Fetch,   No details information was left on voicemail asking for a callback from the nurse.

## 2022-07-01 ENCOUNTER — Ambulatory Visit: Payer: BLUE CROSS/BLUE SHIELD | Admitting: "Endocrinology

## 2022-07-01 ENCOUNTER — Other Ambulatory Visit: Payer: Self-pay | Admitting: Family

## 2022-07-01 ENCOUNTER — Other Ambulatory Visit: Payer: Self-pay | Admitting: "Endocrinology

## 2022-07-01 DIAGNOSIS — E039 Hypothyroidism, unspecified: Secondary | ICD-10-CM

## 2022-07-01 MED ORDER — LEVOTHYROXINE SODIUM 100 MCG PO TABS: 100.0000 ug | ORAL_TABLET | Freq: Every day | ORAL | 0 refills | Status: AC

## 2022-07-01 NOTE — Telephone Encounter (Signed)
Left a message requesting pt return call to the office. ?

## 2022-07-01 NOTE — Telephone Encounter (Signed)
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 States she had to cancel her appt. Yesterday due to an emergency with her mother. Pt had lab work for her appt and asked if you would look at the results and adjust her levothyroxine without seeing her. Pt take levothyroxine 136mg daily.

## 2022-07-02 NOTE — Telephone Encounter (Signed)
Discussed with pt, understanding voiced. Pt scheduled for January 5.

## 2022-07-02 NOTE — Addendum Note (Signed)
Addended by: Ellin Saba on: 07/02/2022 11:50 AM   Modules accepted: Orders

## 2022-09-11 ENCOUNTER — Ambulatory Visit: Payer: BLUE CROSS/BLUE SHIELD | Admitting: Family

## 2022-09-30 ENCOUNTER — Ambulatory Visit: Payer: BLUE CROSS/BLUE SHIELD | Admitting: Family

## 2022-10-03 ENCOUNTER — Ambulatory Visit: Payer: BLUE CROSS/BLUE SHIELD | Admitting: "Endocrinology

## 2022-10-22 ENCOUNTER — Ambulatory Visit: Payer: BLUE CROSS/BLUE SHIELD | Admitting: Family

## 2023-01-18 IMAGING — US US THYROID
1 series · 14 of 25 positions shown · non-contrast
Comparison: None Available.

CLINICAL DATA: 55-year-old female with thyroid nodule

EXAM:
THYROID ULTRASOUND
TECHNIQUE: Ultrasound examination of the thyroid gland and adjacent soft
tissues was performed.

[Series 1: us thyroid · 0.05mm/px · 14 of 52 slices shown]
[im 1/52]
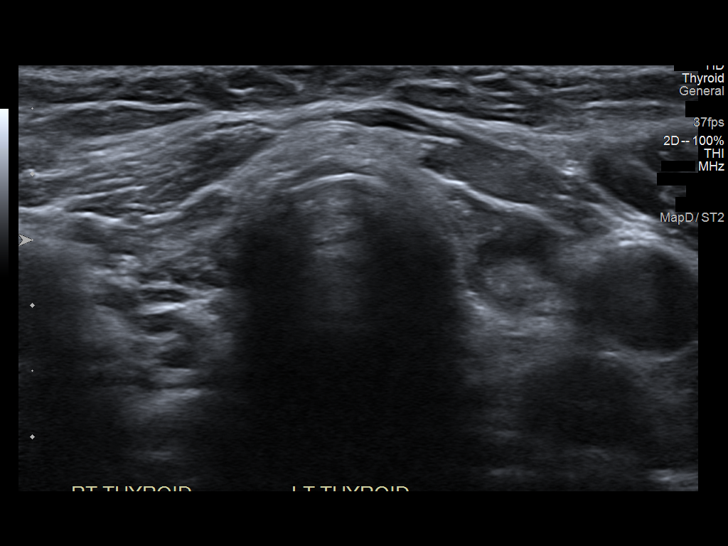
[im 5/52]
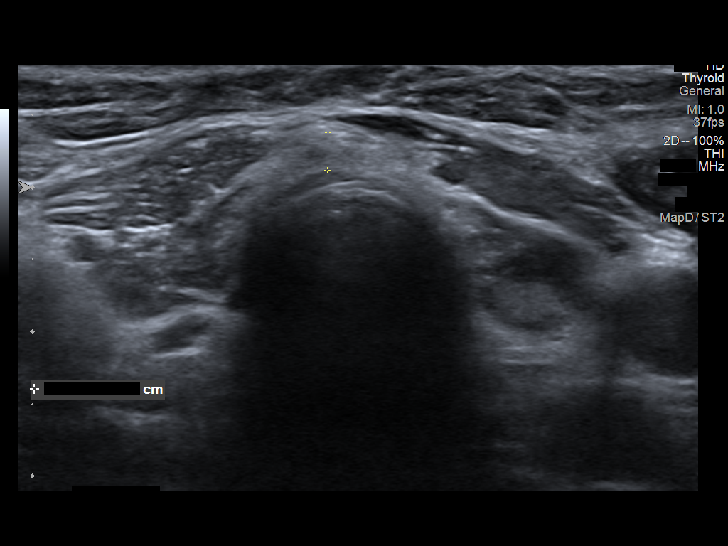
[im 9/52]
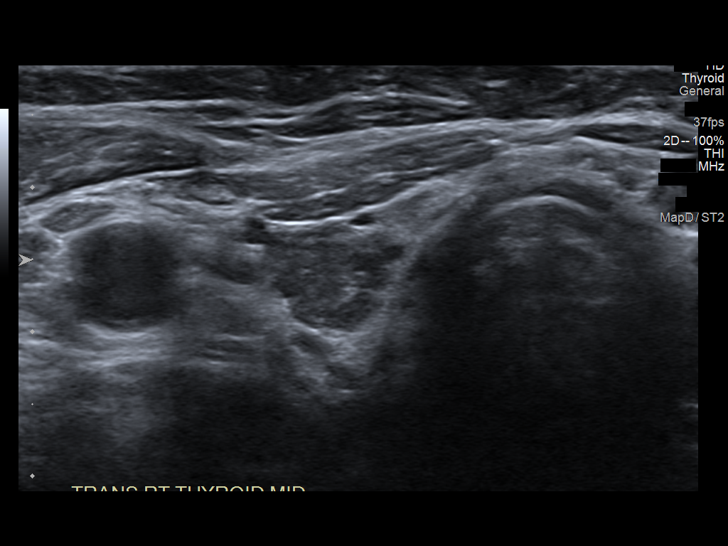
[im 13/52]
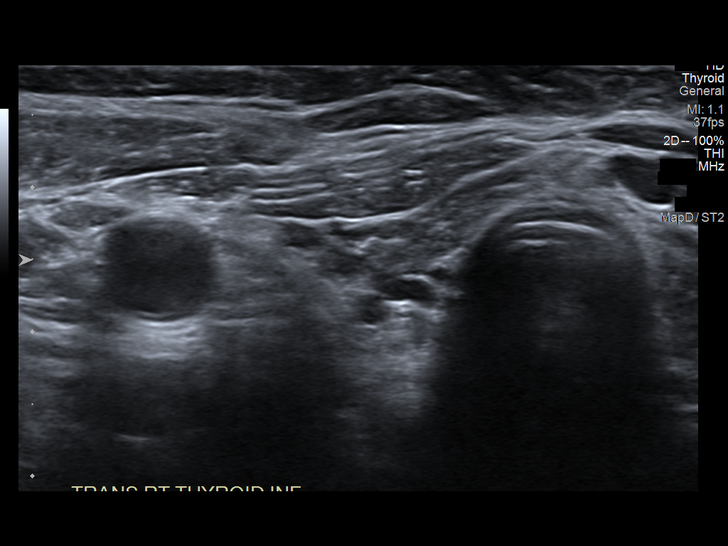
[im 18/52]
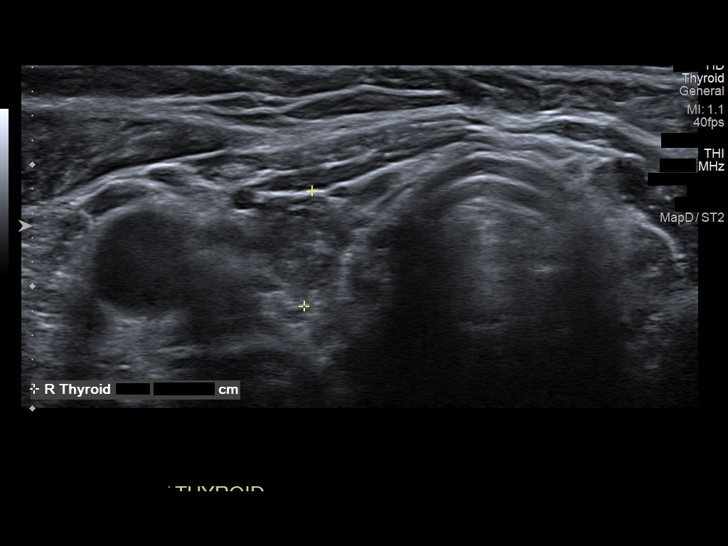
[im 20/52]
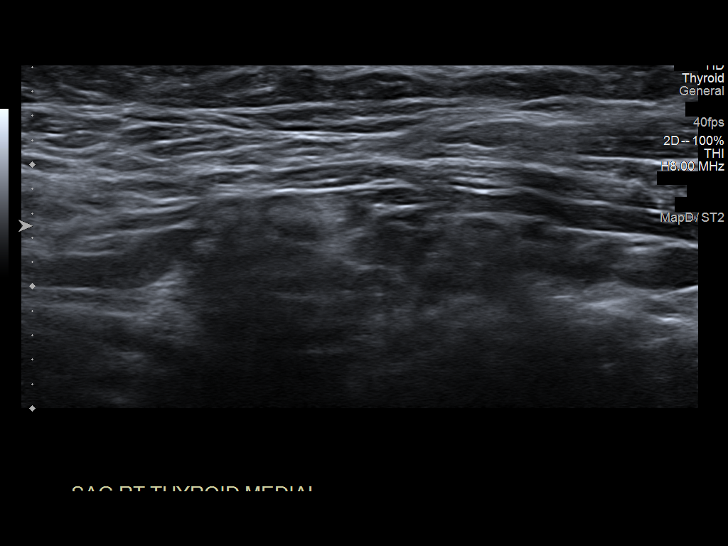
[im 24/52]
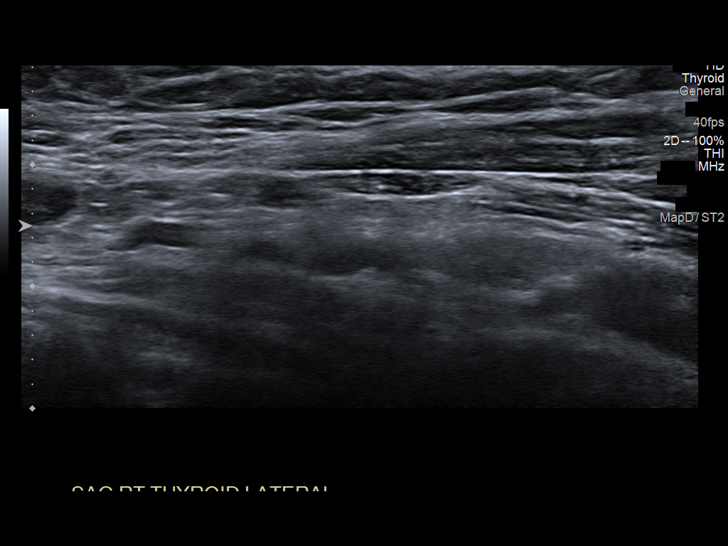
[im 28/52]
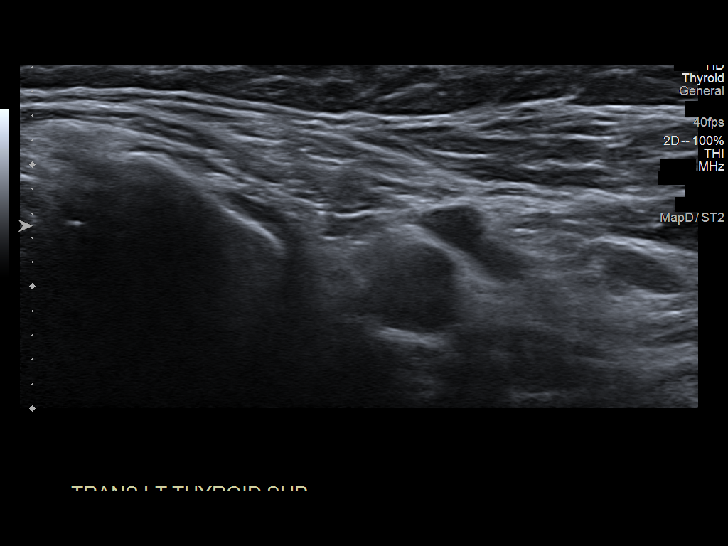
[im 32/52]
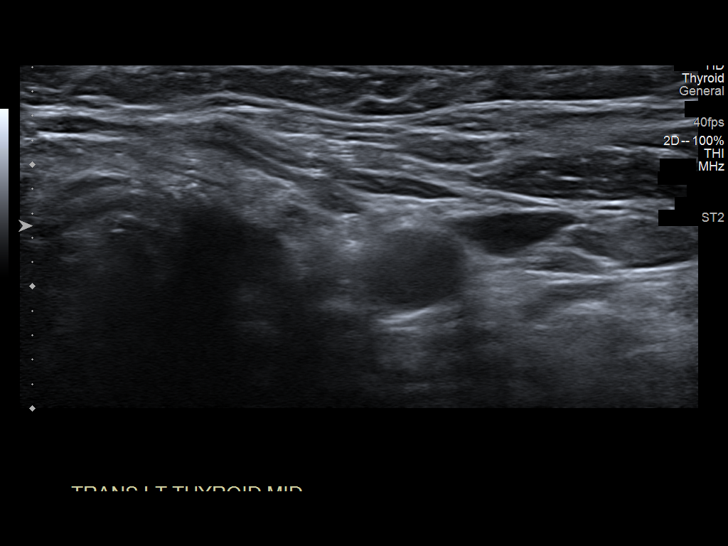
[im 35/52]
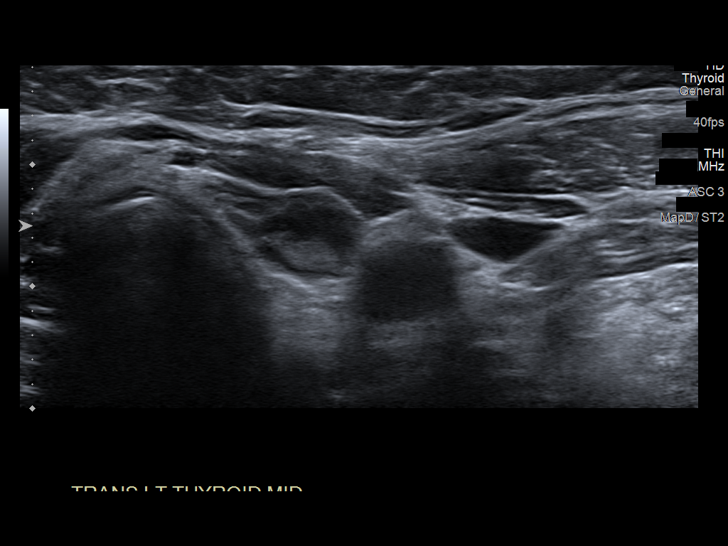
[im 39/52]
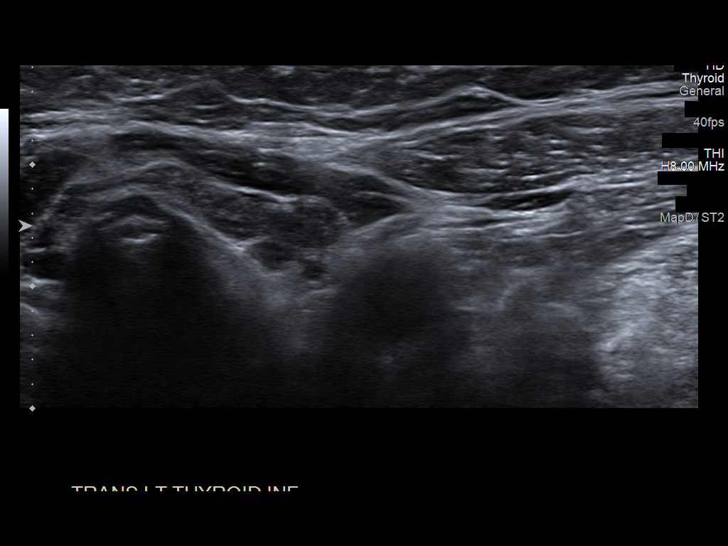
[im 43/52]
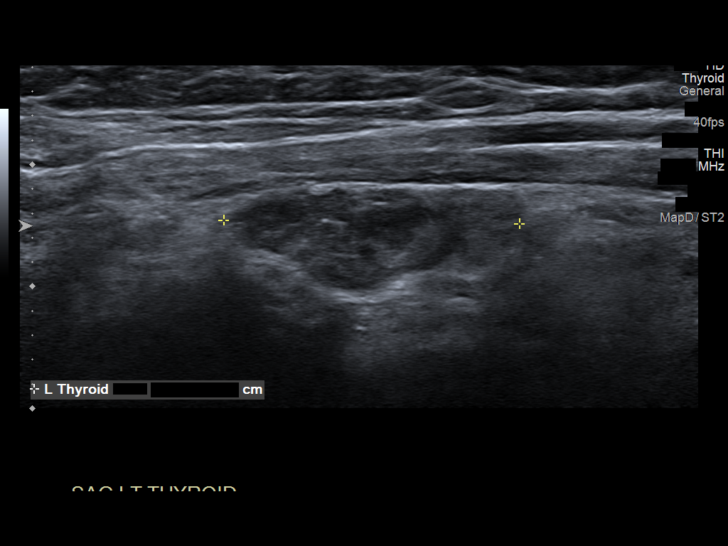
[im 47/52]
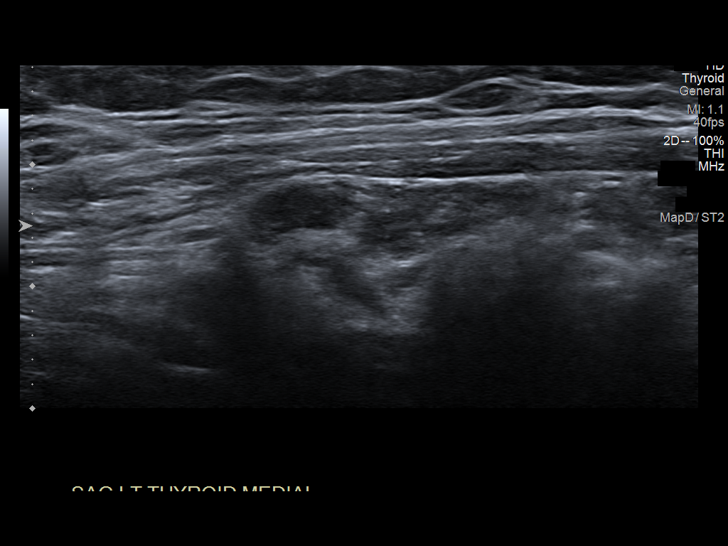
[im 52/52]
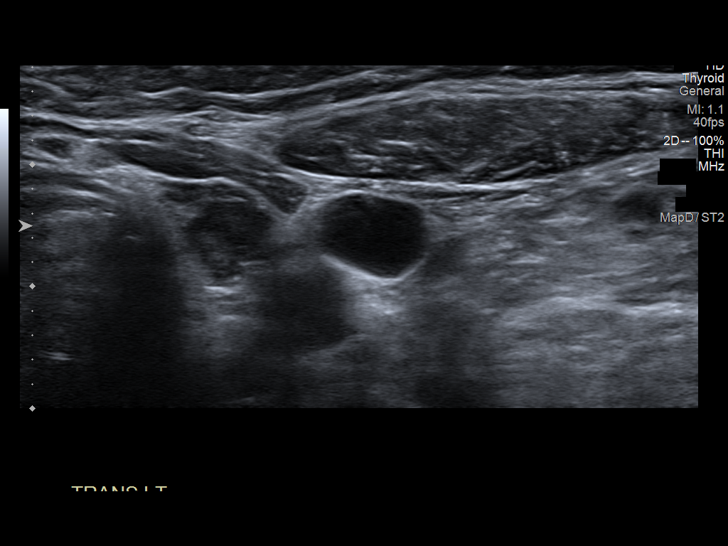

[14 of 25 positions shown; findings below may reference images not displayed]

FINDINGS: Parenchymal Echotexture: Moderately heterogenous

Isthmus: 0.3 cm

Right lobe: 2.6 cm x 1.0 cm x 1.0 cm

Left lobe: 2.4 cm x 1.1 cm x 1.0 cm

_________________________________________________________

Estimated total number of nodules >/= 1 cm: 0

Number of spongiform nodules >/=  2 cm not described below (TR1): 0

Number of mixed cystic and solid nodules >/= 1.5 cm not described
below (TR2): 0

_________________________________________________________

No discrete nodules are seen within the thyroid gland.

No adenopathy
IMPRESSION: Heterogeneous atrophic thyroid suggesting medical thyroid disease.

## 2023-10-29 ENCOUNTER — Ambulatory Visit
Admission: RE | Admit: 2023-10-29 | Discharge: 2023-10-29 | Disposition: A | Discharge status: Home / Self Care | Payer: BLUE CROSS/BLUE SHIELD | Source: Ambulatory Visit | Attending: Internal Medicine | Admitting: Internal Medicine

## 2023-10-29 ENCOUNTER — Other Ambulatory Visit: Payer: Self-pay | Admitting: Internal Medicine

## 2023-10-29 DIAGNOSIS — M542 Cervicalgia: Secondary | ICD-10-CM | POA: Insufficient documentation

## 2023-10-29 DIAGNOSIS — M5459 Other low back pain: Secondary | ICD-10-CM
